# Patient Record
Sex: Female | Born: 1950
Health system: Southern US, Community
[De-identification: ages and names within clinical notes are randomized; demographics above are authoritative.]

## PROBLEM LIST (undated history)

## (undated) DIAGNOSIS — M5430 Sciatica, unspecified side: Secondary | ICD-10-CM

## (undated) DIAGNOSIS — Z87898 Personal history of other specified conditions: Secondary | ICD-10-CM

## (undated) DIAGNOSIS — E079 Disorder of thyroid, unspecified: Secondary | ICD-10-CM

## (undated) DIAGNOSIS — L659 Nonscarring hair loss, unspecified: Secondary | ICD-10-CM

## (undated) HISTORY — DX: Sciatica, unspecified side: M54.30

## (undated) HISTORY — DX: Personal history of other specified conditions: Z87.898

## (undated) HISTORY — DX: Disorder of thyroid, unspecified: E07.9

## (undated) HISTORY — DX: Nonscarring hair loss, unspecified: L65.9

---

## 1984-12-24 HISTORY — PX: ABDOMINAL HYSTERECTOMY: SHX81

## 1998-11-22 ENCOUNTER — Other Ambulatory Visit: Admission: RE | Admit: 1998-11-22 | Discharge: 1998-11-22 | Payer: Self-pay | Admitting: *Deleted

## 1999-08-21 ENCOUNTER — Ambulatory Visit (HOSPITAL_COMMUNITY): Admission: RE | Admit: 1999-08-21 | Discharge: 1999-08-21 | Payer: Self-pay | Admitting: Neurology

## 1999-08-21 ENCOUNTER — Encounter: Payer: Self-pay | Admitting: *Deleted

## 2000-01-22 ENCOUNTER — Other Ambulatory Visit: Admission: RE | Admit: 2000-01-22 | Discharge: 2000-01-22 | Payer: Self-pay | Admitting: *Deleted

## 2000-02-17 ENCOUNTER — Emergency Department (HOSPITAL_COMMUNITY): Admission: EM | Admit: 2000-02-17 | Discharge: 2000-02-17 | Payer: Self-pay | Admitting: Emergency Medicine

## 2000-02-20 ENCOUNTER — Encounter: Admission: RE | Admit: 2000-02-20 | Discharge: 2000-02-20 | Payer: Self-pay | Admitting: Family Medicine

## 2000-02-20 ENCOUNTER — Encounter: Payer: Self-pay | Admitting: Family Medicine

## 2001-02-12 ENCOUNTER — Other Ambulatory Visit: Admission: RE | Admit: 2001-02-12 | Discharge: 2001-02-12 | Payer: Self-pay | Admitting: *Deleted

## 2001-02-13 ENCOUNTER — Other Ambulatory Visit: Admission: RE | Admit: 2001-02-13 | Discharge: 2001-02-13 | Payer: Self-pay | Admitting: *Deleted

## 2001-02-13 ENCOUNTER — Encounter (INDEPENDENT_AMBULATORY_CARE_PROVIDER_SITE_OTHER): Payer: Self-pay

## 2002-02-16 ENCOUNTER — Other Ambulatory Visit: Admission: RE | Admit: 2002-02-16 | Discharge: 2002-02-16 | Payer: Self-pay | Admitting: *Deleted

## 2004-01-10 ENCOUNTER — Ambulatory Visit (HOSPITAL_COMMUNITY): Admission: RE | Admit: 2004-01-10 | Discharge: 2004-01-10 | Payer: Self-pay | Admitting: Gastroenterology

## 2004-02-24 ENCOUNTER — Other Ambulatory Visit: Admission: RE | Admit: 2004-02-24 | Discharge: 2004-02-24 | Payer: Self-pay | Admitting: *Deleted

## 2005-03-29 ENCOUNTER — Encounter: Admission: RE | Admit: 2005-03-29 | Discharge: 2005-03-29 | Payer: Self-pay | Admitting: Family Medicine

## 2006-04-23 ENCOUNTER — Ambulatory Visit: Payer: Self-pay | Admitting: Internal Medicine

## 2006-10-25 ENCOUNTER — Encounter: Admission: RE | Admit: 2006-10-25 | Discharge: 2006-10-25 | Payer: Self-pay | Admitting: Family Medicine

## 2007-02-26 ENCOUNTER — Emergency Department (HOSPITAL_COMMUNITY): Admission: EM | Admit: 2007-02-26 | Discharge: 2007-02-26 | Payer: Self-pay | Admitting: Emergency Medicine

## 2007-03-21 ENCOUNTER — Other Ambulatory Visit: Admission: RE | Admit: 2007-03-21 | Discharge: 2007-03-21 | Payer: Self-pay | Admitting: Obstetrics & Gynecology

## 2007-11-12 IMAGING — CR DG PELVIS 1-2V
1 series · 1 of 1 positions shown · non-contrast
Comparison: none

CLINICAL DATA: Left hip pain for one year.  No specific injury.
 PELVIS, ONE VIEW:
 There is no evidence of fracture or diastasis.  No other significant bone or soft tissue abnormalities are identified.

[view not recorded]
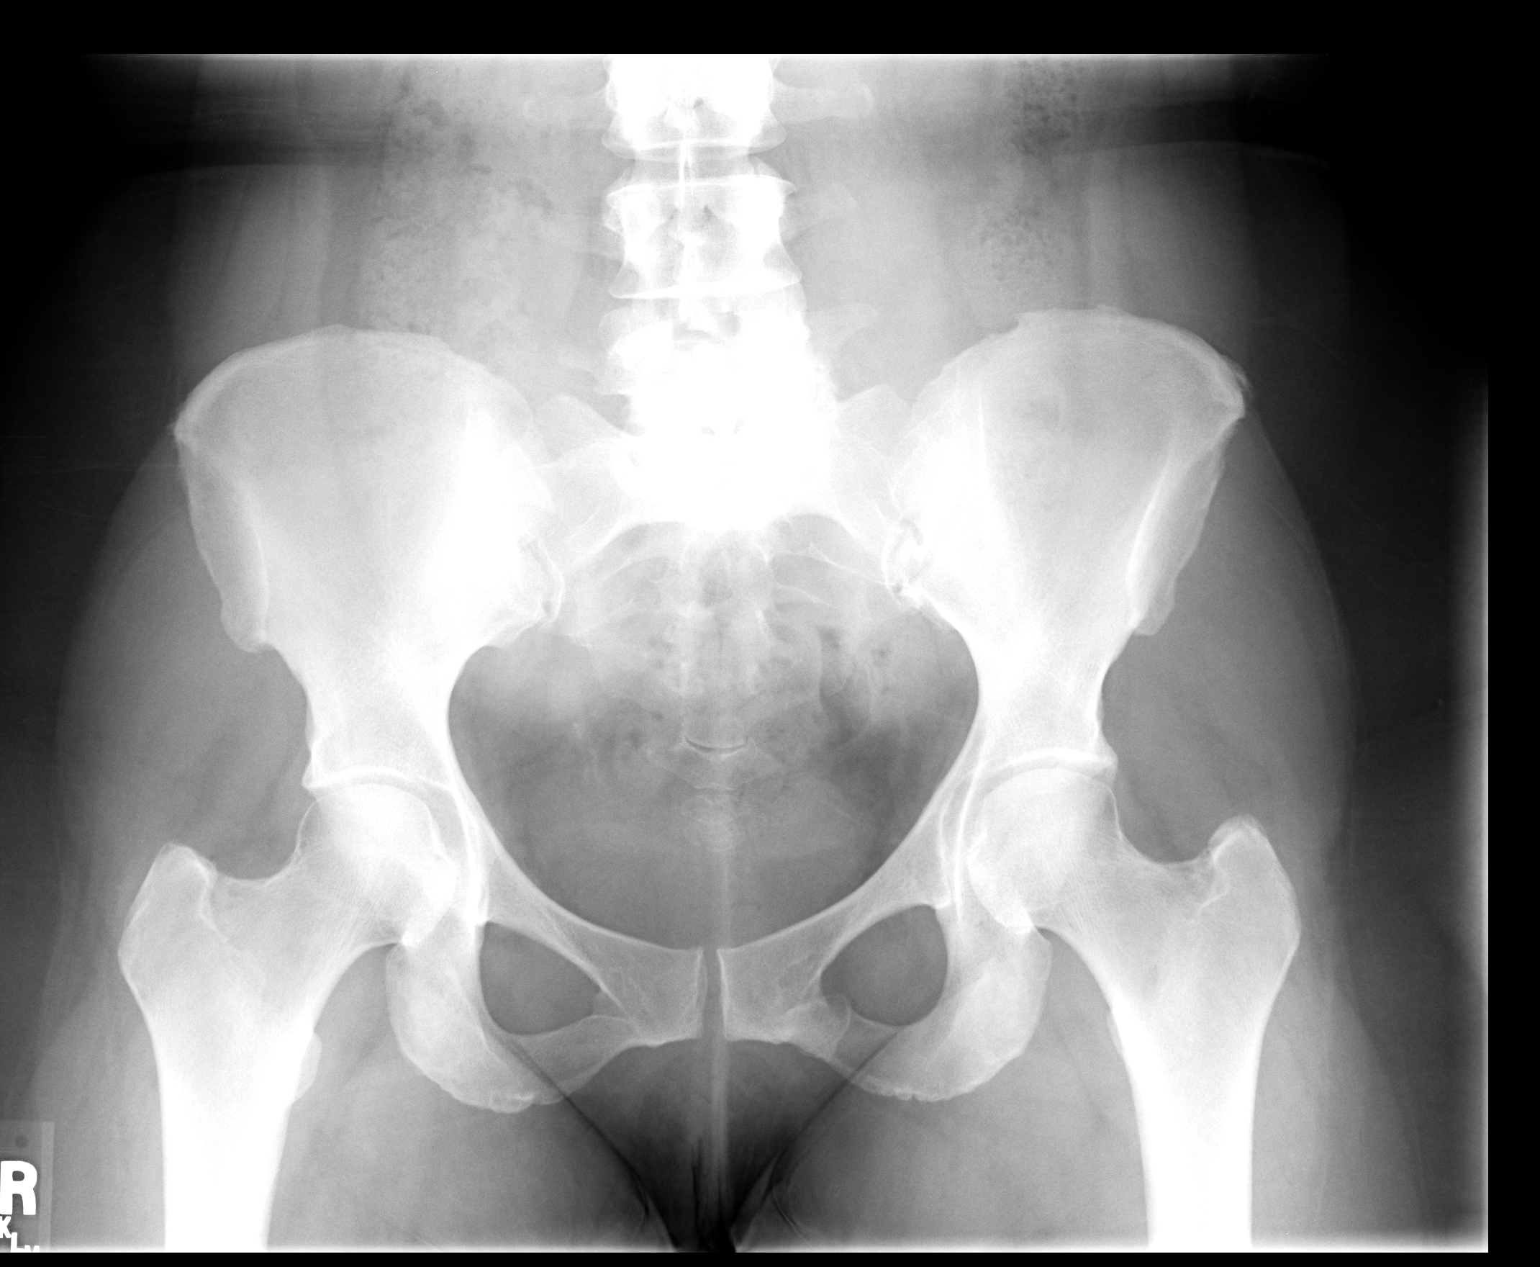

[1 of 1 positions shown; findings below may reference images not displayed]

IMPRESSION: Normal study.

## 2008-04-20 ENCOUNTER — Encounter: Admission: RE | Admit: 2008-04-20 | Discharge: 2008-04-20 | Payer: Self-pay | Admitting: Family Medicine

## 2008-05-13 ENCOUNTER — Other Ambulatory Visit: Admission: RE | Admit: 2008-05-13 | Discharge: 2008-05-13 | Payer: Self-pay | Admitting: Obstetrics & Gynecology

## 2009-08-09 ENCOUNTER — Encounter: Admission: RE | Admit: 2009-08-09 | Discharge: 2009-08-09 | Payer: Self-pay | Admitting: Endocrinology

## 2010-04-11 LAB — HM MAMMOGRAPHY

## 2011-05-11 NOTE — Op Note (Signed)
Deborah Alvarez, Deborah Alvarez                         ACCOUNT NO.:  [id number]   MEDICAL RECORD NO.:  [id number]                   PATIENT TYPE:  AMB   LOCATION:  ENDO                                 FACILITY:  [facility]   PHYSICIAN:  Anselmo Rod, M.D.               DATE OF BIRTH:  January 26, 1951   DATE OF PROCEDURE:  01/10/2004  DATE OF DISCHARGE:                                 OPERATIVE REPORT   PROCEDURE PERFORMED:  Screening colonoscopy.   ENDOSCOPIST:  Charna Elizabeth, M.D.   INSTRUMENT USED:  Olympus video colonoscope.   INDICATIONS FOR PROCEDURE:  The patient is a 60 year old African-American  female with history of rectal bleeding.  Rule out colonic polyps, masses,  etc.   PREPROCEDURE PREPARATION:  Informed consent was procured from the patient.  The patient was fasted for eight hours prior to the procedure and prepped  with a bottle of magnesium citrate and a gallon of GoLYTELY the night prior  to the procedure.   PREPROCEDURE PHYSICAL:  The patient had stable vital signs.  Neck supple.  Chest clear to auscultation.  S1 and S2 regular.  Abdomen soft with normal  bowel sounds.   DESCRIPTION OF PROCEDURE:  The patient was placed in left lateral decubitus  position and sedated with 80 mg of Demerol and 8 mg of Versed in slow  incremental doses.  Once the patient was adequately sedated and maintained  on low flow oxygen and continuous cardiac monitoring, the Olympus video  colonoscope was advanced from the rectum to the cecum and terminal ileum  without difficulty.  There was some residual stool in the colon and multiple  washes were done.  No masses, polyps, erosions, ulcerations or diverticula  were seen.  Small internal hemorrhoids were appreciated on retroflexion in  the rectum.  The appendicular orifice and ileocecal valve were clearly  visualized and photographed.   IMPRESSION:  Normal colonoscopy up to the terminal ileum except for small  internal hemorrhoids.   RECOMMENDATIONS:  1. Continue high fiber diet with liberal fluid intake.  2. Repeat colorectal cancer screening is recommended in the next 10 years     unless the patient develops any abnormal symptoms in the interim.  3. Outpatient followup as need arises in the future.                                               Anselmo Rod, M.D.    JNM/MEDQ  D:  01/10/2004  T:  01/10/2004  Job:  [id number]   cc:   Duncan Dull, M.D.  [address]  [place]  [place] [address]  Fax: [phone]

## 2012-05-21 LAB — HM DEXA SCAN

## 2013-04-20 ENCOUNTER — Other Ambulatory Visit: Payer: Self-pay | Admitting: Radiology

## 2013-06-05 ENCOUNTER — Ambulatory Visit: Payer: Self-pay | Admitting: Certified Nurse Midwife

## 2013-06-29 ENCOUNTER — Ambulatory Visit (INDEPENDENT_AMBULATORY_CARE_PROVIDER_SITE_OTHER): Payer: BC Managed Care – PPO | Admitting: Certified Nurse Midwife

## 2013-06-29 ENCOUNTER — Encounter: Payer: Self-pay | Admitting: Certified Nurse Midwife

## 2013-06-29 VITALS — BP 102/60 | HR 60 | Resp 16 | Ht 61.5 in | Wt 147.0 lb

## 2013-06-29 DIAGNOSIS — Z01419 Encounter for gynecological examination (general) (routine) without abnormal findings: Secondary | ICD-10-CM

## 2013-06-29 DIAGNOSIS — Z0000 Encounter for general adult medical examination without abnormal findings: Secondary | ICD-10-CM

## 2013-06-29 LAB — POCT URINALYSIS DIPSTICK
Leukocytes, UA: NEGATIVE
Nitrite, UA: NEGATIVE
Protein, UA: NEGATIVE
pH, UA: 5

## 2013-06-29 NOTE — Patient Instructions (Signed)

## 2013-06-29 NOTE — Progress Notes (Signed)
62 y.o. [id number] Married African American Fe here for annual exam. Menopausal no HRT. Denies vaginal bleeding. Using Olive Oil for moisture. Thyroid medication stable, management with Endocrine.  Sees PCP for labs and aex.  Patient had left breast biopsy 4-14 for cyst, benign per patient.(No record here)Record with PCP. No other health issues today.  Celebrating 30 years of marriage!  Patient's last menstrual period was 12/24/1984.          Sexually active: yes  The current method of family planning is status post hysterectomy.    Exercising: yes  cardio,strength training, weights & yoga Smoker:  no  Health Maintenance: Pap: 6/12 neg MMG:  4/14 & biopsy Colonoscopy:  2005 Patient did IFOB with PCP negative per patient BMD:   2013 TDaP:  2007 Labs: Poct urine-neg Self breast exam: done occ   reports that she has never smoked. She does not have any smokeless tobacco history on file. She reports that she drinks about 2.0 ounces of alcohol per week. She reports that she does not use illicit drugs.  Past Medical History  Diagnosis Date  . Thyroid disease   . Alopecia   . Hx of abnormal Pap smear     hx severe dysplasia    Past Surgical History  Procedure Laterality Date  . 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 Cholecalciferol (VITAMIN D) 400 UNITS capsule Take 400 Units by mouth daily. Takes 2 tablets twice daily.      . fish oil-omega-3 fatty acids 1000 MG capsule Take 2 g by mouth daily.      Marland Kitchen levothyroxine (SYNTHROID, LEVOTHROID) 88 MCG tablet Take 88 mcg by mouth daily before breakfast.      . minoxidil (ROGAINE) 2 % external solution Apply 1 application topically 2 (two) times daily.      . Multiple Vitamin (MULTIVITAMIN) capsule Take 1 capsule by mouth daily.      Marland Kitchen omeprazole (PRILOSEC) 20 MG capsule Take 20 mg by mouth daily.      . vitamin E 100 UNIT capsule Take  100 Units by mouth daily.       No current facility-administered medications for this visit.    Family History  Problem Relation Age of Onset  . Diabetes Father     ROS:  Pertinent items are noted in HPI.  Otherwise, a comprehensive ROS was negative.  Exam:   Ht 5' 1.5" (1.562 m)  Wt 147 lb (66.679 kg)  BMI 27.33 kg/m2  LMP 12/24/1984 Height: 5' 1.5" (156.2 cm)  Ht Readings from Last 3 Encounters:  06/29/13 5' 1.5" (1.562 m)    General appearance: alert, cooperative and appears stated age Head: Normocephalic, without obvious abnormality, atraumatic Neck: no adenopathy, supple, symmetrical, trachea midline and thyroid normal to inspection and palpation Lungs: clear to auscultation bilaterally Breasts: normal appearance, no masses or tenderness, No nipple retraction or dimpling, No nipple discharge or bleeding, No axillary or supraclavicular adenopathy Heart: regular rate and rhythm Abdomen: soft, non-tender; no masses,  no organomegaly Extremities: extremities normal, atraumatic, no cyanosis or edema Skin: Skin color, texture, turgor normal. No rashes or lesions Lymph nodes: Cervical, supraclavicular, and axillary nodes normal. No abnormal inguinal nodes palpated Neurologic: Grossly normal   Pelvic: External genitalia:  no lesions              Urethra:  normal appearing urethra with no masses, tenderness or lesions  Bartholin's and Skene's: normal                 Vagina: normal appearing vagina with normal color and discharge, no lesions              Cervix: absent              Pap taken: yes Bimanual Exam:  Uterus:  uterus absent              Adnexa: normal adnexa and no mass, fullness, tenderness               Rectovaginal: Confirms               Anus:  normal sphincter tone, no lesions  A:  Well Woman with normal exam  Menopausal no HRT  History of abnormal pap with [facility], with normal pap smear  Hypothyroid stable medication with Endocrine  management  Recent Left breast biopsy for cyst noted on mammogram, benign per patient- records requested.   P:   Reviewed health and wellness pertinent to exam  Aware of need to advise if vaginal bleeding  Continue follow up with endocrine as indicated.  Pap smear as per guidelines   Mammogram yearly or as indicated pap smear taken today  counseled on breast self exam, mammography screening, adequate intake of calcium and vitamin D, diet and exercise  return annually or prn  An After Visit Summary was printed and given to the patient.  Reviewed, TL

## 2014-07-02 ENCOUNTER — Ambulatory Visit: Payer: BC Managed Care – PPO | Admitting: Certified Nurse Midwife

## 2014-07-05 ENCOUNTER — Ambulatory Visit (INDEPENDENT_AMBULATORY_CARE_PROVIDER_SITE_OTHER): Payer: BC Managed Care – PPO | Admitting: Certified Nurse Midwife

## 2014-07-05 ENCOUNTER — Encounter: Payer: Self-pay | Admitting: Certified Nurse Midwife

## 2014-07-05 VITALS — BP 112/78 | HR 84 | Resp 18 | Ht 62.0 in | Wt 146.0 lb

## 2014-07-05 DIAGNOSIS — Z01419 Encounter for gynecological examination (general) (routine) without abnormal findings: Secondary | ICD-10-CM

## 2014-07-05 DIAGNOSIS — E039 Hypothyroidism, unspecified: Secondary | ICD-10-CM

## 2014-07-05 DIAGNOSIS — Z0000 Encounter for general adult medical examination without abnormal findings: Secondary | ICD-10-CM

## 2014-07-05 DIAGNOSIS — Z124 Encounter for screening for malignant neoplasm of cervix: Secondary | ICD-10-CM

## 2014-07-05 LAB — POCT URINALYSIS DIPSTICK
BILIRUBIN UA: NEGATIVE
Blood, UA: NEGATIVE
Glucose, UA: NEGATIVE
KETONES UA: NEGATIVE
LEUKOCYTES UA: NEGATIVE
NITRITE UA: NEGATIVE
PH UA: 5
Protein, UA: NEGATIVE
Urobilinogen, UA: NEGATIVE

## 2014-07-05 NOTE — Progress Notes (Signed)
63 y.o. [id number] Married African American Fe here for annual exam. Menopausal no HRT, denies vaginal bleeding or vaginal dryness. Sees PCP yearly with labs, aex, prn all normal. Sees Dr Romero BellingBalin for hypothyroid with stable medication. Using Olive Oil for vaginal dryness, working well. No health issues today.  Patient's last menstrual period was 12/24/1984.          Sexually active: Yes.    The current method of family planning is status post hysterectomy.    Exercising: Yes.    Cardio, Weights, pillates, zumba 4 x weekly  Smoker:  no  Health Maintenance: Pap: 06/2013 Neg. HR HPV Neg MMG: 03/2014 BIRADS2: Benign  Colonoscopy: 02/2014 Polyps - Every 10 years  BMD: 04/2012   TDaP: 2009 Labs: PCP   reports that she has never smoked. She does not have any smokeless tobacco history on file. She reports that she drinks about 2 - 2.5 ounces of alcohol per week. She reports that she does not use illicit drugs.  Past Medical History  Diagnosis Date  . Alopecia   . Hx of abnormal Pap smear     hx severe dysplasia  . Thyroid disease     hypothyroidism    Past Surgical History  Procedure Laterality Date  . 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 ALPRAZolam (XANAX) 0.25 MG tablet Take 0.25 mg by mouth at bedtime as needed.       . Cholecalciferol (VITAMIN D) 400 UNITS capsule Take 400 Units by mouth daily.       . Ibuprofen (ADVIL) 200 MG CAPS Take by mouth as needed.      Marland Kitchen. levothyroxine (SYNTHROID, LEVOTHROID) 88 MCG tablet Take 88 mcg by mouth daily before breakfast.      . Multiple Vitamin (MULTIVITAMIN) capsule Take 1 capsule by mouth daily.      . Omega-3 Fatty Acids (FISH OIL PO) Take by mouth daily.      Marland Kitchen. omeprazole (PRILOSEC) 20 MG capsule Take 20 mg by mouth daily.       No current facility-administered medications for this visit.    Family History  Problem Relation Age of  Onset  . Diabetes Father   . Hypertension Mother   . Breast cancer Maternal Aunt     ROS:  Pertinent items are noted in HPI.  Otherwise, a comprehensive ROS was negative.  Exam:   BP 112/78  Pulse 84  Resp 18  Ht 5\' 2"  (1.575 m)  Wt 146 lb (66.225 kg)  BMI 26.70 kg/m2  LMP 12/24/1984 Height: 5\' 2"  (157.5 cm)  Ht Readings from Last 3 Encounters:  07/05/14 5\' 2"  (1.575 m)  06/29/13 5' 1.5" (1.562 m)    General appearance: alert, cooperative and appears stated age Head: Normocephalic, without obvious abnormality, atraumatic Neck: no adenopathy, supple, symmetrical, trachea midline and thyroid normal to inspection and palpation and non-palpable Lungs: clear to auscultation bilaterally Breasts: normal appearance, no masses or tenderness, No nipple retraction or dimpling, No nipple discharge or bleeding, No axillary or supraclavicular adenopathy Heart: regular rate and rhythm Abdomen: soft, non-tender; no masses,  no organomegaly Extremities: extremities normal, atraumatic, no cyanosis or edema Skin: Skin color, texture, turgor normal. No rashes or lesions Lymph nodes: Cervical, supraclavicular, and axillary nodes normal. No abnormal inguinal nodes palpated Neurologic: Grossly normal   Pelvic: External genitalia:  no lesions  Urethra:  normal appearing urethra with no masses, tenderness or lesions              Bartholin's and Skene's: normal                 Vagina: normal appearing vagina with normal color and discharge, no lesions              Cervix: absent              Pap taken: Yes.   Bimanual Exam:  Uterus:  uterus absent              Adnexa: normal adnexa and no mass, fullness, tenderness               Rectovaginal: Confirms               Anus:  normal sphincter tone, no lesions  A:  Well Woman with normal exam  Menopausal no HRT  Hypothyroid stable with Endocrine management  History of severe dysplasia with s/pTAH ovaries retained    P:   Reviewed  health and wellness pertinent to exam  Continue as indicated with MD  Pap smear taken today with HPVHR   counseled on breast self exam, mammography screening, adequate intake of calcium and vitamin D, diet and exercise  return annually or prn  An After Visit Summary was printed and given to the patient.

## 2014-07-05 NOTE — Progress Notes (Signed)
Reviewed personally.  M. Suzanne Fredda Clarida, MD.  

## 2014-07-05 NOTE — Patient Instructions (Signed)

## 2014-07-08 LAB — IPS PAP TEST WITH HPV

## 2014-08-25 ENCOUNTER — Telehealth: Payer: Self-pay

## 2014-08-25 NOTE — Telephone Encounter (Signed)
Called pt to give bmd results. lmtcb

## 2014-09-02 NOTE — Telephone Encounter (Signed)
Patient notified of results. See results scanned in

## 2014-09-14 ENCOUNTER — Telehealth: Payer: Self-pay

## 2014-09-14 NOTE — Telephone Encounter (Signed)
Called pt to give her bmd results & pt states she was called with these results already. We received duplicate reports

## 2014-10-25 ENCOUNTER — Encounter: Payer: Self-pay | Admitting: Certified Nurse Midwife

## 2015-04-18 ENCOUNTER — Other Ambulatory Visit: Payer: Self-pay | Admitting: Family Medicine

## 2015-04-18 ENCOUNTER — Ambulatory Visit
Admission: RE | Admit: 2015-04-18 | Discharge: 2015-04-18 | Disposition: A | Discharge status: Home / Self Care | Payer: BLUE CROSS/BLUE SHIELD | Source: Ambulatory Visit | Attending: Family Medicine | Admitting: Family Medicine

## 2015-04-18 DIAGNOSIS — S4991XA Unspecified injury of right shoulder and upper arm, initial encounter: Secondary | ICD-10-CM

## 2015-04-27 ENCOUNTER — Ambulatory Visit (INDEPENDENT_AMBULATORY_CARE_PROVIDER_SITE_OTHER): Payer: BLUE CROSS/BLUE SHIELD | Admitting: Podiatry

## 2015-04-27 ENCOUNTER — Encounter: Payer: Self-pay | Admitting: Podiatry

## 2015-04-27 VITALS — BP 120/85 | HR 91 | Resp 15

## 2015-04-27 DIAGNOSIS — L600 Ingrowing nail: Secondary | ICD-10-CM

## 2015-04-27 MED ORDER — NEOMYCIN-POLYMYXIN-HC 3.5-10000-1 OT SOLN: OTIC | Status: DC

## 2015-04-27 NOTE — Patient Instructions (Signed)

## 2015-04-27 NOTE — Progress Notes (Signed)
   Subjective:    Patient ID: Deborah BootCharlene Kattner, female    DOB: [birth year], 64 y.o.   MRN: [id number]  HPI Pt presents with right great nail thickness and discoloration   Review of Systems  All other systems reviewed and are negative.      Objective:   Physical Exam        Assessment & Plan:

## 2015-04-28 NOTE — Progress Notes (Signed)
Subjective:     Patient ID: Deborah Alvarez, female   DOB: 19-Dec-1951, 64 y.o.   MRN: [id number]  HPI patient presents with damaged hallux nail right knowing that she needs to have it removed and been thinking about it for the last couple years well the nail continues to be sore and impossible to cut   Review of Systems     Objective:   Physical Exam Neurovascular status intact muscle strength adequate with deformed thick brittle hallux nail right foot that's painful when pressed dorsally    Assessment:     Damaged hallux nail right with pain    Plan:     Reviewed condition discussing conservative and surgical treatment options and patient wants it fixed permanently. Explained procedure and risk associated with this and at this time infiltrated the right hallux 60 mg Xylocaine Marcaine mixture remove the hallux nail exposed matrix and applied phenol 5 applications 30 seconds followed by alcohol lavaged and sterile dressing. Gave instructions on soaks and reappoint

## 2015-05-19 ENCOUNTER — Encounter: Payer: Self-pay | Admitting: Podiatry

## 2015-05-19 ENCOUNTER — Ambulatory Visit (INDEPENDENT_AMBULATORY_CARE_PROVIDER_SITE_OTHER): Payer: BLUE CROSS/BLUE SHIELD | Admitting: Podiatry

## 2015-05-19 VITALS — BP 119/80 | HR 76 | Resp 14

## 2015-05-19 DIAGNOSIS — L600 Ingrowing nail: Secondary | ICD-10-CM | POA: Diagnosis not present

## 2015-05-19 NOTE — Patient Instructions (Signed)

## 2015-05-19 NOTE — Progress Notes (Signed)
Subjective:     Patient ID: Deborah Alvarez, female   DOB: 12/28/1950, 64 y.o.   MRN: [id number]  HPI patient presents stating I was just concerned about this right big toenail that was removed about a month ago that there is still crusted tissue and drainage and I want to make sure I don't have infection   Review of Systems     Objective:   Physical Exam Neurovascular status intact muscle strength adequate range of motion within normal limits with a minimal amount of drainage in the proximal portion of the nailbed and crust formation with no indications of proximal edema erythema drainage noted    Assessment:     Localized paronychia infection right hallux    Plan:     Advised on continued soaks at this time and utilization of Band-Aids therapy during the day. Reappoint to recheck

## 2015-07-12 ENCOUNTER — Encounter: Payer: Self-pay | Admitting: Certified Nurse Midwife

## 2015-07-12 ENCOUNTER — Ambulatory Visit (INDEPENDENT_AMBULATORY_CARE_PROVIDER_SITE_OTHER): Payer: BLUE CROSS/BLUE SHIELD | Admitting: Certified Nurse Midwife

## 2015-07-12 VITALS — BP 104/70 | HR 70 | Resp 16 | Ht 61.75 in | Wt 149.0 lb

## 2015-07-12 DIAGNOSIS — Z0000 Encounter for general adult medical examination without abnormal findings: Secondary | ICD-10-CM | POA: Diagnosis not present

## 2015-07-12 DIAGNOSIS — Z01419 Encounter for gynecological examination (general) (routine) without abnormal findings: Secondary | ICD-10-CM

## 2015-07-12 LAB — POCT URINALYSIS DIPSTICK
BILIRUBIN UA: NEGATIVE
Glucose, UA: NEGATIVE
Ketones, UA: NEGATIVE
LEUKOCYTES UA: NEGATIVE
Nitrite, UA: NEGATIVE
PROTEIN UA: NEGATIVE
RBC UA: NEGATIVE
UROBILINOGEN UA: NEGATIVE
pH, UA: 5

## 2015-07-12 NOTE — Progress Notes (Signed)
64 y.o. [id number] Married  African American Fe here for annual exam. Menopausal no HRT. Denies vaginal bleeding or vaginal dryness. Patient sees Dr.Wolters for aex/labs. Sees Dr. Talmage Nap for hypothyroid, no changes with medication. Alopecia unchanged just wears a weave.  Recent right greater toe surgery 4/16, no concerns. No health issues today.  Patient's last menstrual period was 12/24/1984.          Sexually active: Yes.    The current method of family planning is status post hysterectomy.    Exercising: Yes.    cardio, strength,weights Smoker:  no  Health Maintenance: Pap: 07-05-14 neg HPV HR neg MMG: 5/16 Colonoscopy:  3/15 polyps f/u 44yrs BMD:   08-19-14 low bone mass,f/u 37yrs TDaP:  2016 Labs: poct urine-neg Self breast exam: done occ   reports that she has never smoked. She has never used smokeless tobacco. She reports that she drinks about 2.4 oz of alcohol per week. She reports that she does not use illicit drugs.  Past Medical History  Diagnosis Date  . Alopecia   . Hx of abnormal Pap smear     hx severe dysplasia  . Thyroid disease     hypothyroidism    Past Surgical History  Procedure Laterality Date  . 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 ALPRAZolam (XANAX) 0.25 MG tablet Take 0.25 mg by mouth at bedtime as needed.     . Cholecalciferol (VITAMIN D) 400 UNITS capsule Take 400 Units by mouth daily.     . Ibuprofen (ADVIL) 200 MG CAPS Take by mouth as needed.    Marland Kitchen levothyroxine (SYNTHROID, LEVOTHROID) 88 MCG tablet Take 88 mcg by mouth daily before breakfast.    . Multiple Vitamin (MULTIVITAMIN) capsule Take 1 capsule by mouth daily.    . Naproxen Sodium (ALEVE PO) Take by mouth as needed.    . Omega-3 Fatty Acids (FISH OIL PO) Take by mouth daily.    Marland Kitchen omeprazole (PRILOSEC) 20 MG capsule Take 20 mg by mouth daily.     No current facility-administered  medications for this visit.    Family History  Problem Relation Age of Onset  . Diabetes Father   . Hypertension Mother   . Breast cancer Maternal Aunt     ROS:  Pertinent items are noted in HPI.  Otherwise, a comprehensive ROS was negative.  Exam:   BP 104/70 mmHg  Pulse 70  Resp 16  Ht 5' 1.75" (1.568 m)  Wt 149 lb (67.586 kg)  BMI 27.49 kg/m2  LMP 12/24/1984 Height: 5' 1.75" (156.8 cm) Ht Readings from Last 3 Encounters:  07/12/15 5' 1.75" (1.568 m)  07/05/14  (1.575 m)  06/29/13 5' 1.5" (1.562 m)    General appearance: alert, cooperative and appears stated age Head: Normocephalic, without obvious abnormality, atraumatic Neck: no adenopathy, supple, symmetrical, trachea midline and thyroid normal to inspection and palpation Lungs: clear to auscultation bilaterally Breasts: normal appearance, no masses or tenderness, No nipple retraction or dimpling, No nipple discharge or bleeding, No axillary or supraclavicular adenopathy Heart: regular rate and rhythm Abdomen: soft, non-tender; no masses,  no organomegaly Extremities: extremities normal, atraumatic, no cyanosis or edema Skin: Skin color, texture, turgor normal. No rashes or lesions Lymph nodes: Cervical, supraclavicular, and axillary nodes normal. No abnormal inguinal nodes palpated Neurologic: Grossly normal   Pelvic: External genitalia:  no lesions  Urethra:  normal appearing urethra with no masses, tenderness or lesions              Bartholin's and Skene's: normal                 Vagina: normal appearing vagina with normal color and discharge, no lesions              Cervix: absent              Pap taken: No. Bimanual Exam:  Uterus:  uterus absent              Adnexa: normal adnexa and no mass, fullness, tenderness               Rectovaginal: Confirms               Anus:  normal sphincter tone, no lesions  Chaperone present: Yes  A:  Well Woman with normal exam  Menopausal no HRT. S/P TAH  with ovaries retained due to abnormal pap smear dysplasia 1986  Vaginal dryness using Olive Oil with good results  Hypothyroid with Endocrine management  P:   Reviewed health and wellness pertinent to exam  Continue follow up with endocrine as indicated  Pap smear not taken today   counseled on breast self exam, mammography screening, adequate intake of calcium and vitamin D, diet and exercise  return annually or prn  An After Visit Summary was printed and given to the patient.

## 2015-07-12 NOTE — Patient Instructions (Signed)

## 2015-07-12 NOTE — Progress Notes (Signed)
Reviewed personally.  M. Suzanne Drayven Marchena, MD.  

## 2016-05-05 IMAGING — CR DG SHOULDER 2+V*R*
3 series · 3 of 3 positions shown · non-contrast
Comparison: None.

CLINICAL DATA: Fell onto RIGHT shoulder 2 days ago.  Pain.

EXAM:
RIGHT SHOULDER - 2+ VIEW

[w shoulder grashey right]
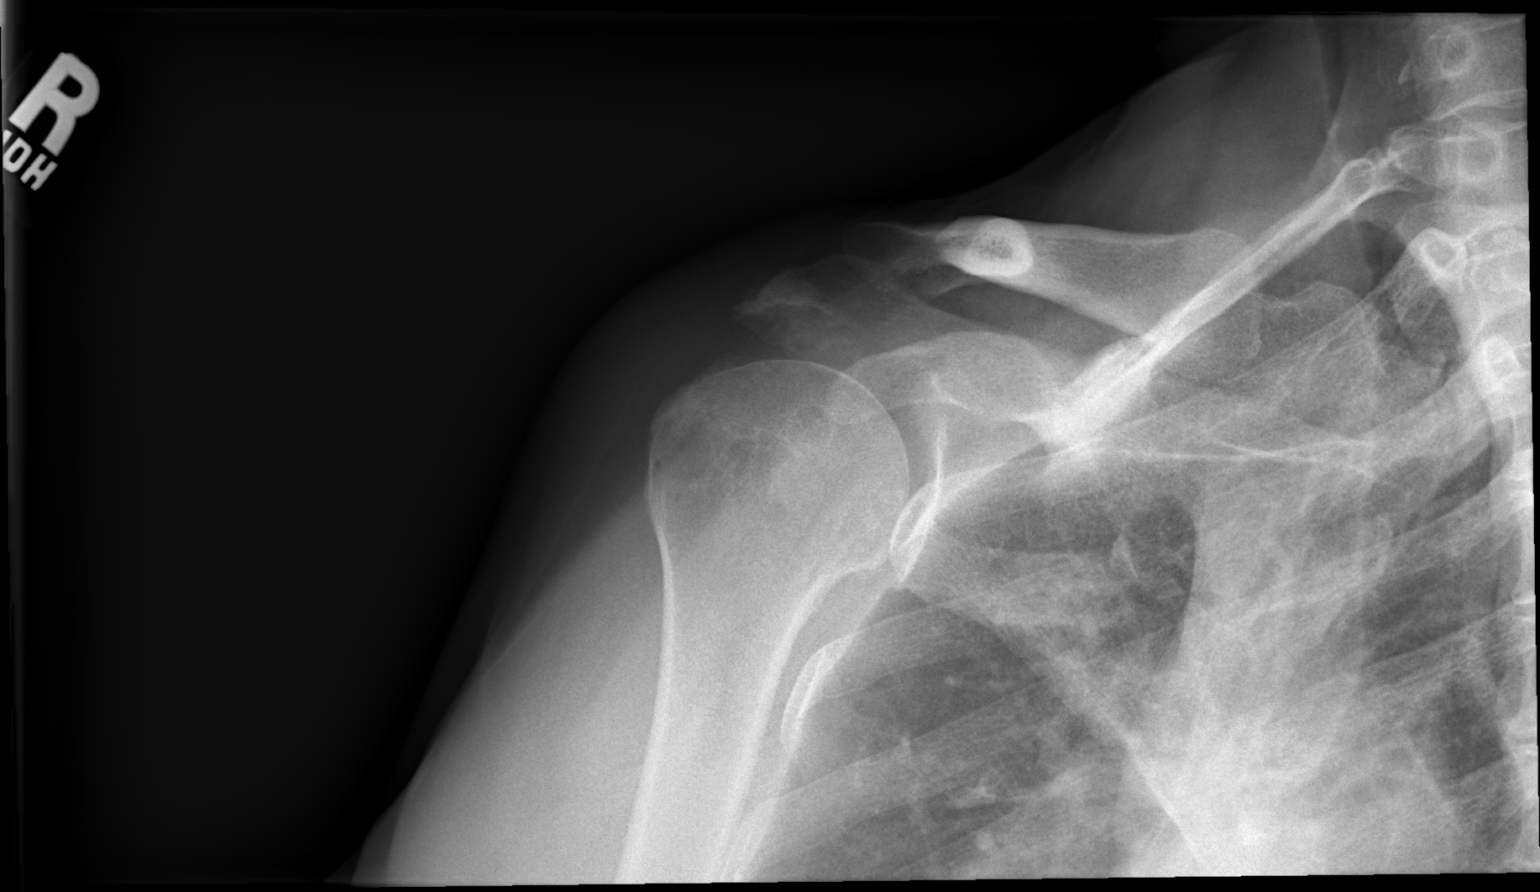

[w shoulder y-view right]
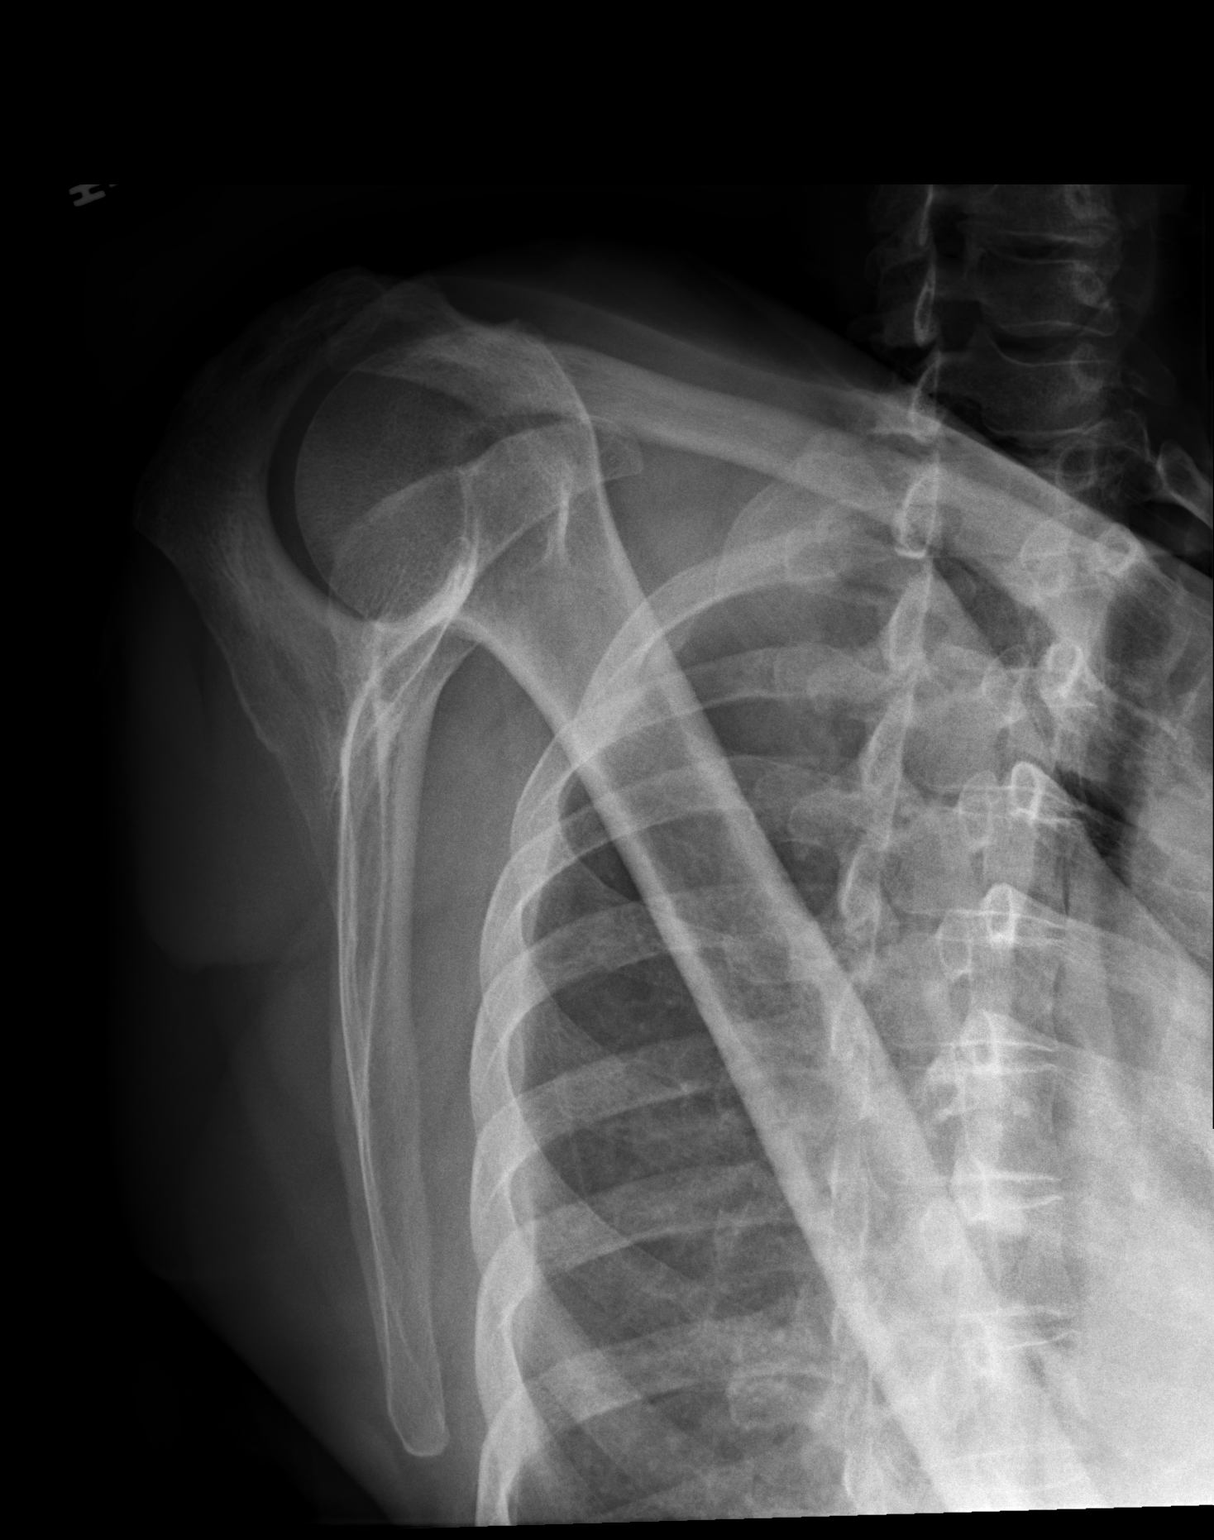

[w shoulder axillary right]
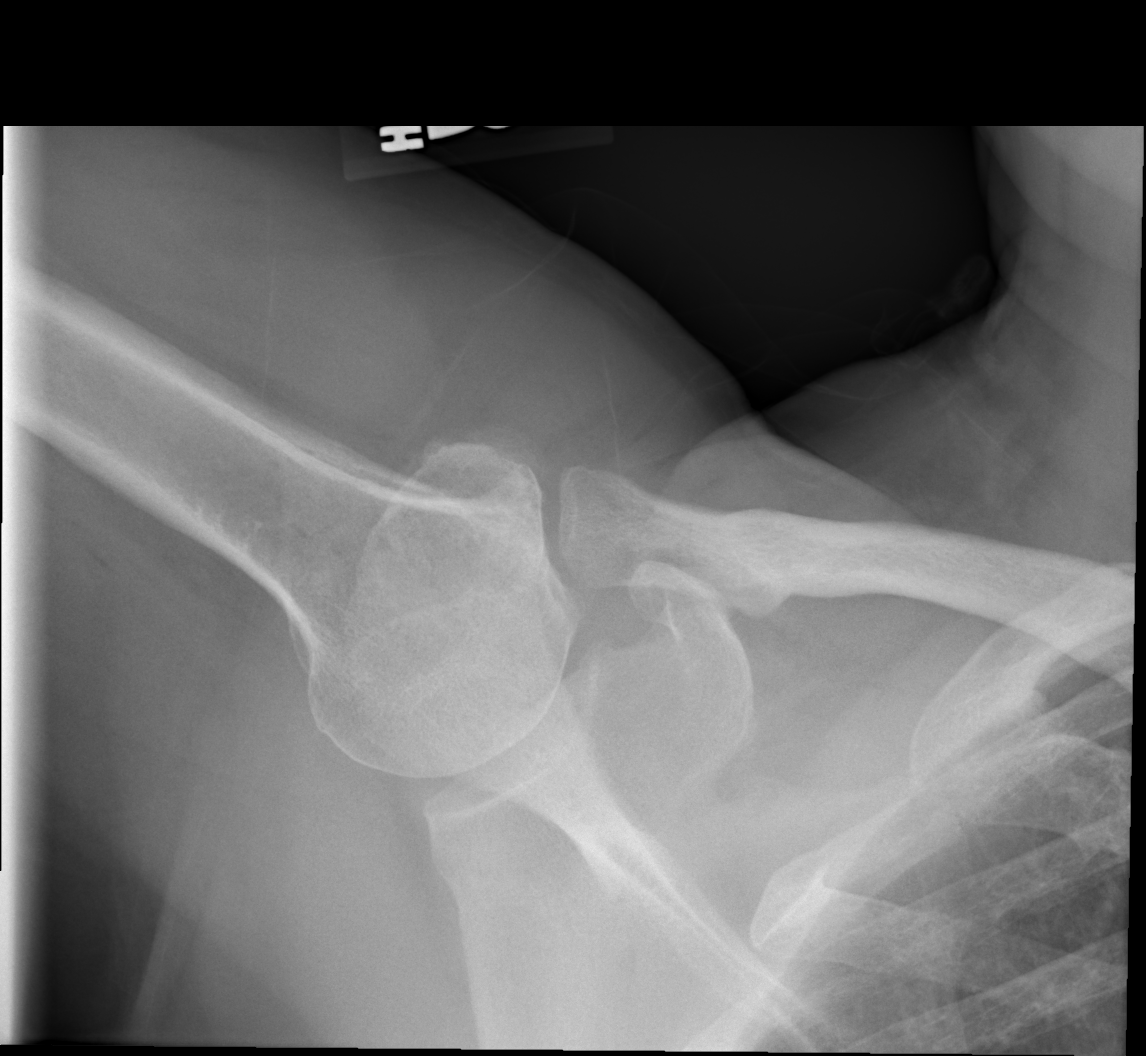

[3 of 3 positions shown; findings below may reference images not displayed]

FINDINGS: The patient has limited range of motion. Therefore nonstandard
imaging was obtained.

There is no evidence of fracture or dislocation. There is no
evidence of arthropathy or other focal bone abnormality. Soft
tissues are unremarkable.
IMPRESSION: Negative.

## 2016-07-12 ENCOUNTER — Ambulatory Visit: Payer: BLUE CROSS/BLUE SHIELD | Admitting: Certified Nurse Midwife

## 2016-07-17 ENCOUNTER — Encounter: Payer: Self-pay | Admitting: Certified Nurse Midwife

## 2016-07-17 ENCOUNTER — Ambulatory Visit (INDEPENDENT_AMBULATORY_CARE_PROVIDER_SITE_OTHER): Payer: BLUE CROSS/BLUE SHIELD | Admitting: Certified Nurse Midwife

## 2016-07-17 VITALS — BP 110/68 | HR 80 | Resp 16 | Ht 61.25 in | Wt 153.0 lb

## 2016-07-17 DIAGNOSIS — Z124 Encounter for screening for malignant neoplasm of cervix: Secondary | ICD-10-CM

## 2016-07-17 DIAGNOSIS — Z01419 Encounter for gynecological examination (general) (routine) without abnormal findings: Secondary | ICD-10-CM | POA: Diagnosis not present

## 2016-07-17 DIAGNOSIS — Z0000 Encounter for general adult medical examination without abnormal findings: Secondary | ICD-10-CM | POA: Diagnosis not present

## 2016-07-17 DIAGNOSIS — N951 Menopausal and female climacteric states: Secondary | ICD-10-CM | POA: Diagnosis not present

## 2016-07-17 DIAGNOSIS — N898 Other specified noninflammatory disorders of vagina: Secondary | ICD-10-CM

## 2016-07-17 LAB — POCT URINALYSIS DIPSTICK
Bilirubin, UA: NEGATIVE
GLUCOSE UA: NEGATIVE
Ketones, UA: NEGATIVE
Leukocytes, UA: NEGATIVE
Nitrite, UA: NEGATIVE
PH UA: 5
Protein, UA: NEGATIVE
RBC UA: NEGATIVE
UROBILINOGEN UA: NEGATIVE

## 2016-07-17 NOTE — Progress Notes (Signed)
65 y.o. G16P3003 Married  Caucasian Fe here for annual exam. Menopausal no HRT. Using Olive Oil for vaginal dryness. Some increase in vaginal discharge with slight odor. No pain with sexual activity. Please check. Sees Dr. Mick Sell for aex/labs,thyroid management. Alopecia(genetic) unchanged, continues to wear a weave and is doing well. Father passed earlier the year. Emotionally doing well. No other health concerns today.  Patient's last menstrual period was 12/24/1984.          Sexually active: Yes.    The current method of family planning is status post hysterectomy.    Exercising: Yes.    cardio, strength & weights Smoker:  no  Health Maintenance: Pap:  07-05-14 neg HPV HR neg MMG:  4/17 neg Colonoscopy:  3/15 polyps f/u 56yrs BMD:   2015 f/u 25yrs, scheduled for 9/17 TDaP:  2016 Shingles: 2014 Pneumonia: not done Hep C and HIV: 2017 hep c neg Labs: poct urine-neg Self breast exam: done monthly   reports that she has never smoked. She has never used smokeless tobacco. She reports that she drinks about 2.4 oz of alcohol per week . She reports that she does not use drugs.  Past Medical History:  Diagnosis Date  . Alopecia   . Hx of abnormal Pap smear    hx severe dysplasia  . Thyroid disease    hypothyroidism    Past Surgical History:  Procedure Laterality Date  . 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 ALPRAZolam (XANAX) 0.25 MG tablet Take 0.25 mg by mouth at bedtime as needed.     . Cholecalciferol (VITAMIN D) 400 UNITS capsule Take 400 Units by mouth daily.     . Ibuprofen (ADVIL) 200 MG CAPS Take by mouth as needed.    Marland Kitchen levothyroxine (SYNTHROID, LEVOTHROID) 88 MCG tablet Take 88 mcg by mouth daily before breakfast.    . Multiple Vitamin (MULTIVITAMIN) capsule Take 1 capsule by mouth daily.    . Naproxen Sodium (ALEVE PO) Take by mouth as needed.    . Omega-3 Fatty  Acids (FISH OIL PO) Take by mouth daily.    Marland Kitchen omeprazole (PRILOSEC) 20 MG capsule Take 20 mg by mouth daily.     No current facility-administered medications for this visit.     Family History  Problem Relation Age of Onset  . Diabetes Father   . Hypertension Mother   . Breast cancer Maternal Aunt     ROS:  Pertinent items are noted in HPI.  Otherwise, a comprehensive ROS was negative.  Exam:   LMP 12/24/1984    Ht Readings from Last 3 Encounters:  07/12/15 5' 1.75" (1.568 m)  07/05/14  (1.575 m)  06/29/13 5' 1.5" (1.562 m)    General appearance: alert, cooperative and appears stated age Head: Normocephalic, without obvious abnormality, atraumatic Neck: no adenopathy, supple, symmetrical, trachea midline and thyroid normal to inspection and palpation Lungs: clear to auscultation bilaterally Breasts: normal appearance, no masses or tenderness, No nipple retraction or dimpling, No nipple discharge or bleeding, No axillary or supraclavicular adenopathy Heart: regular rate and rhythm Abdomen: soft, non-tender; no masses,  no organomegaly Extremities: extremities normal, atraumatic, no cyanosis or edema Skin: Skin color, texture, turgor normal. No rashes or lesions Lymph nodes: Cervical, supraclavicular, and axillary nodes normal. No abnormal inguinal nodes palpated Neurologic: Grossly normal   Pelvic: External genitalia:  no lesions  Urethra:  normal appearing urethra with no masses, tenderness or lesions              Bartholin's and Skene's: normal                 Vagina: normal appearing vagina with normal color and discharge, no lesions              Cervix: absent              Pap taken: Yes.   Bimanual Exam:  Uterus:  uterus absent              Adnexa: normal adnexa and no mass, fullness, tenderness               Rectovaginal: Confirms               Anus:  normal sphincter tone, no lesions  Chaperone present: yes  A:  Well Woman with normal  exam  Menopausal no HRT  S/P TAH ovaries retained severe dysplasia  Vaginal dryness using Olive oil, R/O vaginal infection  Hypothyroid stable with PCP management  Genetic Alopecia no change  P:   Reviewed health and wellness pertinent to exam  Discussed vaginal dryness can lead to vaginal infection due to change in ph. Continue with Olive Oil 3 x weekly, given a sterile needless syringe to help with insertion.warning signs of dryness given and need to advise.  Lab: Affirm will treat per results.  Continue follow up with MD as indicated  Pap smear as above with HPV reflex    counseled on breast self exam, mammography screening, adequate intake of calcium and vitamin D, diet and exercise  return annually or prn  An After Visit Summary was printed and given to the patient.

## 2016-07-17 NOTE — Patient Instructions (Signed)

## 2016-07-18 LAB — WET PREP BY MOLECULAR PROBE
Candida species: NEGATIVE
GARDNERELLA VAGINALIS: NEGATIVE
TRICHOMONAS VAG: NEGATIVE

## 2016-07-18 LAB — IPS PAP TEST WITH REFLEX TO HPV

## 2016-07-18 NOTE — Progress Notes (Signed)
Encounter reviewed Julietta Batterman, MD   

## 2016-11-13 ENCOUNTER — Ambulatory Visit
Admission: RE | Admit: 2016-11-13 | Discharge: 2016-11-13 | Disposition: A | Discharge status: Home / Self Care | Payer: BLUE CROSS/BLUE SHIELD | Source: Ambulatory Visit | Attending: Family Medicine | Admitting: Family Medicine

## 2016-11-13 ENCOUNTER — Other Ambulatory Visit: Payer: Self-pay | Admitting: Family Medicine

## 2016-11-13 DIAGNOSIS — S8992XA Unspecified injury of left lower leg, initial encounter: Secondary | ICD-10-CM

## 2016-12-11 ENCOUNTER — Emergency Department (HOSPITAL_BASED_OUTPATIENT_CLINIC_OR_DEPARTMENT_OTHER)
Admission: EM | Admit: 2016-12-11 | Discharge: 2016-12-11 | Disposition: A | Discharge status: Home / Self Care | Payer: BLUE CROSS/BLUE SHIELD | Attending: Emergency Medicine | Admitting: Emergency Medicine

## 2016-12-11 ENCOUNTER — Encounter (HOSPITAL_BASED_OUTPATIENT_CLINIC_OR_DEPARTMENT_OTHER): Payer: Self-pay | Admitting: *Deleted

## 2016-12-11 DIAGNOSIS — M5442 Lumbago with sciatica, left side: Secondary | ICD-10-CM

## 2016-12-11 DIAGNOSIS — M62830 Muscle spasm of back: Secondary | ICD-10-CM | POA: Insufficient documentation

## 2016-12-11 DIAGNOSIS — E039 Hypothyroidism, unspecified: Secondary | ICD-10-CM | POA: Diagnosis not present

## 2016-12-11 MED ORDER — CYCLOBENZAPRINE HCL 10 MG PO TABS
10.0000 mg | ORAL_TABLET | Freq: Once | ORAL | Status: AC
  Administered 2016-12-11: 10 mg via ORAL
  Filled 2016-12-11: qty 1

## 2016-12-11 MED ORDER — NAPROXEN 500 MG PO TABS: 500.0000 mg | ORAL_TABLET | Freq: Two times a day (BID) | ORAL | 0 refills | Status: DC | PRN

## 2016-12-11 MED ORDER — NAPROXEN 250 MG PO TABS
250.0000 mg | ORAL_TABLET | Freq: Once | ORAL | Status: AC
  Administered 2016-12-11: 250 mg via ORAL
  Filled 2016-12-11: qty 1

## 2016-12-11 MED ORDER — PREDNISONE 50 MG PO TABS
60.0000 mg | ORAL_TABLET | Freq: Once | ORAL | Status: AC
  Administered 2016-12-11: 60 mg via ORAL
  Filled 2016-12-11: qty 1

## 2016-12-11 MED ORDER — PREDNISONE 20 MG PO TABS: ORAL_TABLET | ORAL | 0 refills | Status: DC

## 2016-12-11 MED ORDER — CYCLOBENZAPRINE HCL 10 MG PO TABS: 10.0000 mg | ORAL_TABLET | Freq: Three times a day (TID) | ORAL | 0 refills | Status: DC | PRN

## 2016-12-11 MED FILL — NAPROXEN 500 MG TABLET: 500 | 10 days supply | Qty: 20 | Fill #0

## 2016-12-11 MED FILL — predniSONE 20 MG TABS: 20 | 3 days supply | Qty: 9 | Fill #0

## 2016-12-11 MED FILL — CYCLOBENZAPRINE 10 MG TAB: 10 | 5 days supply | Qty: 15 | Fill #0

## 2016-12-11 NOTE — ED Provider Notes (Signed)
[facility]   CSN: [id number] Arrival date & time: 12/11/16  1334     History   Chief Complaint Chief Complaint  Patient presents with  . Back Pain    HPI Deborah Alvarez is a 65 y.o. female with a PMHx of chronic low back pain, DDD lumbar spine, sciatica, hypothyroidism, and alopecia, who presents to the ED with complaints of gradual onset left lower back pain 4 days that worsened yesterday. She describes the pain as 10/10 constant "spasmy" left lower back pain radiating into the left leg, worse with walking and movement, unrelieved with ibuprofen 400 mg and partially improved with tramadol that she had left over from a knee injury last month. She states that she's been doing a lot of twisting and bending getting ready for the upcoming holiday, but denies any heavy lifting or recent injury/fall. Associated symptoms include tingling in both buttocks and down her left leg, which she states is somewhat of a chronic issue due to her sciatica.  She denies fevers, chills, CP, SOB, abd pain, N/V/D/C, hematuria, dysuria, flank pain, saddle anesthesia/cauda equina symptoms, incontinence of urine/stool, arthralgias, numbness, focal weakness, other symptoms, or hx of CA/IVDU.    The history is provided by the patient and medical records. No language interpreter was used.  Back Pain   This is a recurrent problem. The current episode started more than 2 days ago. The problem occurs constantly. The problem has been gradually worsening. The pain is associated with twisting. The pain is present in the lumbar spine. The quality of the pain is described as cramping (spasmy). The pain radiates to the left thigh. The pain is at a severity of 10/10. The pain is severe. Exacerbated by: walking and movement. The pain is the same all the time. Associated symptoms include tingling. Pertinent negatives include no chest pain, no fever, no numbness, no abdominal pain, no bowel incontinence,  no perianal numbness, no bladder incontinence, no dysuria, no paresis and no weakness. She has tried NSAIDs (and tramadol) for the symptoms.    Past Medical History:  Diagnosis Date  . Alopecia   . Hx of abnormal Pap smear    hx severe dysplasia  . Thyroid disease    hypothyroidism    Patient Active Problem List   Diagnosis Date Noted  . Hypothyroid 07/05/2014    Class: History of    Past Surgical History:  Procedure Laterality Date  . 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 Authorizing Provider  ALPRAZolam (XANAX) 0.25 MG tablet Take 0.25 mg by mouth at bedtime as needed.  06/17/14   Historical Provider, MD  Cholecalciferol (VITAMIN D) 400 UNITS capsule Take 400 Units by mouth daily.     Historical Provider, MD  Ibuprofen (ADVIL) 200 MG CAPS Take by mouth as needed.    Historical Provider, MD  Multiple Vitamin (MULTIVITAMIN) capsule Take 1 capsule by mouth daily.    Historical Provider, MD  Naproxen Sodium (ALEVE PO) Take by mouth as needed.    Historical Provider, MD  Omega-3 Fatty Acids (FISH OIL PO) Take by  mouth daily.    Historical Provider, MD  omeprazole (PRILOSEC) 20 MG capsule Take 20 mg by mouth daily.    Historical Provider, MD  SYNTHROID 100 MCG tablet daily. 07/10/16   Historical Provider, MD    Family History Family History  Problem Relation Age of Onset  . Diabetes Father   . Congestive Heart Failure Father   . Hypertension Mother   . Breast cancer Maternal Aunt     Social History Social History  Substance Use Topics  . Smoking status: Never Smoker  . Smokeless tobacco: Never Used  . Alcohol use 2.4 - 3.0 oz/week    4 - 5 Standard drinks or equivalent per week     Allergies   Codeine  and Sulfa antibiotics   Review of Systems Review of Systems  Constitutional: Negative for chills and fever.  Respiratory: Negative for shortness of breath.   Cardiovascular: Negative for chest pain.  Gastrointestinal: Negative for abdominal pain, bowel incontinence, constipation, diarrhea, nausea and vomiting.  Genitourinary: Negative for bladder incontinence, difficulty urinating (no incontinence), dysuria, flank pain and hematuria.  Musculoskeletal: Positive for back pain. Negative for arthralgias and myalgias.  Skin: Negative for color change.  Allergic/Immunologic: Negative for immunocompromised state.  Neurological: Positive for tingling. Negative for weakness and numbness.       +tingling L leg  Psychiatric/Behavioral: Negative for confusion.   10 Systems reviewed and are negative for acute change except as noted in the HPI.   Physical Exam Updated Vital Signs BP 127/84 (BP Location: Left Arm)   Pulse 86   Temp 98 F (36.7 C) (Oral)   Resp 18   Ht 5\' 1"  (1.549 m)   Wt 68 kg   LMP 12/24/1984   SpO2 99%   BMI 28.34 kg/m   Physical Exam  Constitutional: She is oriented to person, place, and time. Vital signs are normal. She appears well-developed and well-nourished.  Non-toxic appearance. No distress.  Afebrile, nontoxic, NAD  HENT:  Head: Normocephalic and atraumatic.  Mouth/Throat: Oropharynx is clear and moist and mucous membranes are normal.  Eyes: Conjunctivae and EOM are normal. Right eye exhibits no discharge. Left eye exhibits no discharge.  Neck: Normal range of motion. Neck supple.  Cardiovascular: Normal rate, regular rhythm, normal heart sounds and intact distal pulses.  Exam reveals no gallop and no friction rub.   No murmur heard. Pulmonary/Chest: Effort normal and breath sounds normal. No respiratory distress. She has no decreased breath sounds. She has no wheezes. She has no rhonchi. She has no rales.  Abdominal: Soft. Normal appearance and bowel sounds  are normal. She exhibits no distension. There is no tenderness. There is no rigidity, no rebound, no guarding, no CVA tenderness, no tenderness at McBurney's point and negative Murphy's sign.  Soft, NTND, +BS throughout, no r/g/r, neg murphy's, neg mcburney's, no CVA TTP   Musculoskeletal: Normal range of motion.       Lumbar back: She exhibits tenderness and spasm. She exhibits normal range of motion, no bony tenderness and no deformity.       Back:  Lumbar spine with adequate ROM intact without spinous process TTP, no bony stepoffs or deformities, with mild L sided paraspinous muscle TTP and muscle spasms. Strength and sensation grossly intact in all extremities, +SLR on L side, but neg on R side, gait steady. No overlying skin changes. Distal pulses intact.   Neurological: She is alert and oriented to person, place, and time. She has normal strength. No sensory deficit.  Skin: Skin is warm, dry and intact. No rash noted.  Psychiatric: She has a normal mood and affect.  Nursing note and vitals reviewed.    ED Treatments / Results  Labs (all labs ordered are listed, but only abnormal results are displayed) Labs Reviewed - No data to display  EKG  EKG Interpretation None       Radiology No results found.  Procedures Procedures (including critical care time)  Medications Ordered in ED Medications  cyclobenzaprine (FLEXERIL) tablet 10 mg (10 mg Oral Given 12/11/16 1411)  naproxen (NAPROSYN) tablet 250 mg (250 mg Oral Given 12/11/16 1411)  predniSONE (DELTASONE) tablet 60 mg (60 mg Oral Given 12/11/16 1411)     Initial Impression / Assessment and Plan / ED Course  I have reviewed the triage vital signs and the nursing notes.  Pertinent labs & imaging results that were available during my care of the patient were reviewed by me and considered in my medical decision making (see chart for details).  Clinical Course     65 y.o. female here with low back pain on L side x4  days. On exam, no focal midline spinous process TTP, mild L paraspinous muscle TTP and spasm, +SLR on L side. No red flag s/s of low back pain. No s/s of central cord compression or cauda equina. Lower extremities are neurovascularly intact and patient is ambulating without difficulty. No urinary complaints, no flank tenderness, doubt need for U/A, or any other imaging/labs  Patient was counseled on back pain precautions and told to do activity as tolerated but do not lift, push, or pull heavy objects more than 10 pounds for the next week. Patient counseled to use ice or heat on back for no longer than 15 minutes every hour.   Rx given for muscle relaxer and counseled on proper use of muscle relaxant medication. Pt has home tramadol, discussed use of tylenol first but if severe pain she can use tramadol. Urged patient not to drink alcohol, drive, or perform any other activities that requires focus while taking either of these medications. Rx given for NSAID and prednisone.   Patient urged to follow-up with PCP if pain does not improve with treatment and rest or if pain becomes recurrent. Urged to return with worsening severe pain, loss of bowel or bladder control, trouble walking. The patient verbalizes understanding and agrees with the plan.   Final Clinical Impressions(s) / ED Diagnoses   Final diagnoses:  Acute left-sided low back pain with left-sided sciatica  Muscle spasm of back    New Prescriptions New Prescriptions   CYCLOBENZAPRINE (FLEXERIL) 10 MG TABLET    Take 1 tablet (10 mg total) by mouth 3 (three) times daily as needed for muscle spasms.   NAPROXEN (NAPROSYN) 500 MG TABLET    Take 1 tablet (500 mg total) by mouth 2 (two) times daily as needed for mild pain or moderate pain (TAKE WITH MEALS.).   PREDNISONE (DELTASONE) 20 MG TABLET    3 tabs po daily x 3 days starting 12/12/16     Allen Derry, PA-C 12/11/16 1420    Gwyneth Sprout, MD 12/11/16 1527

## 2016-12-11 NOTE — Discharge Instructions (Signed)
Back Pain: Your back pain should be treated with medicines such as ibuprofen or aleve and this back pain should get better over the next 2 weeks.  However if you develop severe or worsening pain, low back pain with fever, numbness, weakness or inability to walk or urinate, you should return to the ER immediately.  Please follow up with your doctor this week for a recheck if still having symptoms.  Avoid heavy lifting over 10 pounds over the next two weeks.  Low back pain is discomfort in the lower back that may be due to injuries to muscles and ligaments around the spine.  Occasionally, it may be caused by a a problem to a part of the spine called a disc.  The pain may last several days or a week;  However, most patients get completely well in 4 weeks.  Self - care:  The application of heat can help soothe the pain.  Maintaining your daily activities, including walking, is encourged, as it will help you get better faster than just staying in bed. Perform gentle stretching as discussed. Drink plenty of fluids.  Medications are also useful to help with pain control.  A commonly prescribed medication includes your home tramadol.  Do not drive or operate heavy machinery while taking this medication. Use additional tylenol as needed for additional pain relief.   Non steroidal anti inflammatory medications including Ibuprofen and naproxen;  These medications help both pain and swelling and are very useful in treating back pain.  They should be taken with food, as they can cause stomach upset, and more seriously, stomach bleeding.    Muscle relaxants like flexeril:  These medications can help with muscle tightness that is a cause of lower back pain.  Most of these medications can cause drowsiness, and it is not safe to drive or use dangerous machinery while taking them.  Prednisone: this will help with the sciatica symptom. Take with breakfast, starting tomorrow 12/12/16.   SEEK IMMEDIATE MEDICAL ATTENTION  IF: New numbness, tingling, weakness, or problem with the use of your arms or legs.  Severe back pain not relieved with medications.  Difficulty with or loss of control of your bowel or bladder control.  Increasing pain in any areas of the body (such as chest or abdominal pain).  Shortness of breath, dizziness or fainting.  Nausea (feeling sick to your stomach), vomiting, fever, or sweats.  You will need to follow up with  Your primary healthcare provider in 1-2 weeks for reassessment.

## 2016-12-11 NOTE — ED Triage Notes (Signed)
Back spasms on the left side x 1 day.  Hx of same.

## 2017-01-22 ENCOUNTER — Ambulatory Visit (INDEPENDENT_AMBULATORY_CARE_PROVIDER_SITE_OTHER): Payer: BLUE CROSS/BLUE SHIELD | Admitting: Sports Medicine

## 2017-01-22 ENCOUNTER — Encounter: Payer: Self-pay | Admitting: Sports Medicine

## 2017-01-22 ENCOUNTER — Ambulatory Visit (INDEPENDENT_AMBULATORY_CARE_PROVIDER_SITE_OTHER): Payer: BLUE CROSS/BLUE SHIELD

## 2017-01-22 DIAGNOSIS — M79674 Pain in right toe(s): Secondary | ICD-10-CM | POA: Diagnosis not present

## 2017-01-22 DIAGNOSIS — M2041 Other hammer toe(s) (acquired), right foot: Secondary | ICD-10-CM

## 2017-01-22 DIAGNOSIS — L84 Corns and callosities: Secondary | ICD-10-CM

## 2017-01-22 NOTE — Progress Notes (Signed)
Subjective: Deborah Alvarez is a 66 y.o. female patient who presents to office for evaluation of Right foot pain. Patient complains of progressive pain especially over the last few days in the Right foot at the 5th toe where there is a corn; states that she wore boots without socks and had pain that got better after peroxide soak; states that she also tried acid corn pad and pedicure; states that she did not like how the area looked after corn pad. Patient denies any other pedal complaints.   Patient Active Problem List   Diagnosis Date Noted  . Hypothyroid 07/05/2014    Class: History of    Current Outpatient Prescriptions on File Prior to Visit  Medication Sig Dispense Refill  . ALPRAZolam (XANAX) 0.25 MG tablet Take 0.25 mg by mouth at bedtime as needed.     . Cholecalciferol (VITAMIN D) 400 UNITS capsule Take 400 Units by mouth daily.     . cyclobenzaprine (FLEXERIL) 10 MG tablet Take 1 tablet (10 mg total) by mouth 3 (three) times daily as needed for muscle spasms. 15 tablet 0  . Ibuprofen (ADVIL) 200 MG CAPS Take by mouth as needed.    . Multiple Vitamin (MULTIVITAMIN) capsule Take 1 capsule by mouth daily.    . naproxen (NAPROSYN) 500 MG tablet Take 1 tablet (500 mg total) by mouth 2 (two) times daily as needed for mild pain or moderate pain (TAKE WITH MEALS.). 20 tablet 0  . Naproxen Sodium (ALEVE PO) Take by mouth as needed.    . Omega-3 Fatty Acids (FISH OIL PO) Take by mouth daily.    Marland Kitchen omeprazole (PRILOSEC) 20 MG capsule Take 20 mg by mouth daily.    . predniSONE (DELTASONE) 20 MG tablet 3 tabs po daily x 3 days starting 12/12/16 9 tablet 0  . SYNTHROID 100 MCG tablet daily.     No current facility-administered medications on file prior to visit.     Allergies  Allergen Reactions  . Codeine     Keeps her awake  . Sulfa Antibiotics Rash    Objective:  General: Alert and oriented x3 in no acute distress  Dermatology: Small macerated hyperkeratotic lesion overlying 5 PIPJ  dorsally on right. No open lesions bilateral lower extremities, no webspace macerations, no ecchymosis bilateral, all nails x 10 are well manicured.  Vascular: Dorsalis Pedis and Posterior Tibial pedal pulses 2/4, Capillary Fill Time 3 seconds,(+) pedal hair growth bilateral, no edema bilateral lower extremities, Temperature gradient within normal limits.  Neurology: Gross sensation intact via light touch bilateral, (-)Tinels sign bilateral.   Musculoskeletal: Semi-flexible hammertoes 2-5 with evidence of previous repair on 5th toes. No tenderness with palpation at PIPJ on right 5th toe. Ankle, Subtalar, Midtarsal, and MTPJ joint range of motion is within normal limits, there is no 1st ray hypermobility noted bilateral, No bunion deformity noted bilateral. No pain with calf compression bilateral.  Strength within normal limits in all groups bilateral.   Gait: Unassisted, Non-antalgic.  Xrays  Right Foot    Impression: Normal osseous mineralization, evidence of previous 5th toe arthroplasty, small spur at lateral 5th met head, no other acute findings.        Assessment and Plan: Problem List Items Addressed This Visit    None    Visit Diagnoses    Corns and callosities    -  Primary   Hammer toe of right foot       Toe pain, right           -  Complete examination performed -Xrays reviewed -Discussed treatement options for pain at hammertoe -Dispensed toe cap and encouraged wider shoe. Advised patient that if pain recurs then may consider local injection  -Patient to return to office as needed or sooner if condition worsens.  Landis Martins, DPM

## 2017-04-12 ENCOUNTER — Encounter: Payer: Self-pay | Admitting: Podiatry

## 2017-04-12 ENCOUNTER — Ambulatory Visit (INDEPENDENT_AMBULATORY_CARE_PROVIDER_SITE_OTHER): Payer: BLUE CROSS/BLUE SHIELD | Admitting: Podiatry

## 2017-04-12 VITALS — BP 125/85 | HR 79 | Resp 16

## 2017-04-12 DIAGNOSIS — L84 Corns and callosities: Secondary | ICD-10-CM | POA: Diagnosis not present

## 2017-04-12 DIAGNOSIS — M779 Enthesopathy, unspecified: Secondary | ICD-10-CM

## 2017-04-12 MED ORDER — TRIAMCINOLONE ACETONIDE 10 MG/ML IJ SUSP
10.0000 mg | Freq: Once | INTRAMUSCULAR | Status: AC
  Administered 2017-04-12: 10 mg

## 2017-04-12 NOTE — Progress Notes (Signed)
Subjective:     Patient ID: Nohelani Benning, female   DOB: [birth year], 66 y.o.   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

## 2017-05-09 ENCOUNTER — Telehealth: Payer: Self-pay

## 2017-05-09 NOTE — Telephone Encounter (Signed)
Left detailed message giving patient her bmd results. dpr signed to be left on mobile number. Results to be scanned in.

## 2017-05-23 ENCOUNTER — Encounter: Payer: Self-pay | Admitting: Certified Nurse Midwife

## 2017-07-25 ENCOUNTER — Encounter: Payer: Self-pay | Admitting: Certified Nurse Midwife

## 2017-07-25 ENCOUNTER — Ambulatory Visit (INDEPENDENT_AMBULATORY_CARE_PROVIDER_SITE_OTHER): Payer: BLUE CROSS/BLUE SHIELD | Admitting: Certified Nurse Midwife

## 2017-07-25 VITALS — BP 104/60 | HR 68 | Resp 16 | Ht 61.5 in | Wt 152.0 lb

## 2017-07-25 DIAGNOSIS — Z01419 Encounter for gynecological examination (general) (routine) without abnormal findings: Secondary | ICD-10-CM

## 2017-07-25 DIAGNOSIS — N951 Menopausal and female climacteric states: Secondary | ICD-10-CM

## 2017-07-25 NOTE — Patient Instructions (Signed)

## 2017-07-25 NOTE — Progress Notes (Signed)
66 y.o. [id number] Married  African American Fe here for annual exam. Menopausal no HRT, Denies vaginal bleeding or vaginal dryness. Has been using Olive oil for dryness without problems. See Dr Paulino RilyWolters PCP for aex and labs and hypothyroid management. Continues to have problems with sciatica, planning visit with Dr. Venetia MaxonStern soon for re-evaluation. Planning on ? retiring soon. No other health issues today.   Patient's last menstrual period was 12/24/1984.          Sexually active: No.  The current method of family planning is status post hysterectomy.    Exercising: Yes.    cardio, strength, flexibility Smoker:  no  Health Maintenance: Pap:  07-05-14 neg HPV HR neg, 07-17-16 neg History of Abnormal Pap: yes MMG:  05-01-17 category a density birads 2:neg Self Breast exams: yes Colonoscopy:  3/15 polyps f/u 3917yrs BMD:  2018  TDaP:  2016 Shingles: 2014 Pneumonia: no Hep C and HIV: Hep c neg 2017 Labs: none   reports that she has never smoked. She has never used smokeless tobacco. She reports that she drinks about 1.8 - 3.0 oz of alcohol per week . She reports that she does not use drugs.  Past Medical History:  Diagnosis Date  . Alopecia   . Hx of abnormal Pap smear    hx severe dysplasia  . Thyroid disease    hypothyroidism    Past Surgical History:  Procedure Laterality Date  . 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 ALPRAZolam (XANAX) 0.25 MG tablet Take 0.25 mg by mouth at bedtime as needed.     . Cholecalciferol (VITAMIN D) 400 UNITS capsule Take 400 Units by mouth daily.     . Ibuprofen (ADVIL) 200 MG CAPS Take by mouth as needed.    . Multiple Vitamin (MULTIVITAMIN) capsule Take 1 capsule by mouth daily.    . Omega-3 Fatty Acids (FISH OIL PO) Take by mouth daily.    Marland Kitchen. omeprazole (PRILOSEC) 20 MG capsule Take 20 mg by mouth daily.    Marland Kitchen. SYNTHROID 100 MCG tablet daily.      No current facility-administered medications for this visit.     Family History  Problem Relation Age of Onset  . Diabetes Father   . Congestive Heart Failure Father   . Hypertension Mother   . Breast cancer Maternal Aunt     ROS:  Pertinent items are noted in HPI.  Otherwise, a comprehensive ROS was negative.  Exam:   BP 104/60   Pulse 68   Resp 16   Ht 5' 1.5" (1.562 m)   Wt 152 lb (68.9 kg)   LMP 12/24/1984   BMI 28.26 kg/m  Height: 5' 1.5" (156.2 cm) Ht Readings from Last 3 Encounters:  07/25/17 5' 1.5" (1.562 m)  12/11/16 5\' 1"  (1.549 m)  07/17/16 5' 1.25" (1.556 m)    General appearance: alert, cooperative and appears stated age Head: Normocephalic, without obvious abnormality, atraumatic Neck: no adenopathy, supple, symmetrical, trachea midline and thyroid normal to inspection and palpation Lungs: clear to auscultation bilaterally Breasts: normal appearance, no masses or tenderness, No nipple retraction or dimpling, No nipple discharge or bleeding, No axillary or supraclavicular adenopathy Heart: regular rate and rhythm Abdomen: soft, non-tender; no masses,  no organomegaly Extremities: extremities normal, atraumatic, no cyanosis or edema Skin: Skin color, texture, turgor normal. No rashes or lesions Lymph nodes: Cervical, supraclavicular, and axillary nodes normal. No  abnormal inguinal nodes palpated Neurologic: Grossly normal   Pelvic: External genitalia:  no lesions              Urethra:  normal appearing urethra with no masses, tenderness or lesions              Bartholin's and Skene's: normal                 Vagina: normal appearing vagina with normal color and discharge, no lesions              Cervix: absent              Pap taken: No. 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 Questions addressed  Continue follow up with PCP as indicated  Pap smear: no   counseled on breast self exam, mammography screening, adequate intake of calcium and vitamin D, diet and exercise, Kegel's exercises  return annually or prn  An After Visit Summary was printed and given to the patient.

## 2017-11-19 DIAGNOSIS — R079 Chest pain, unspecified: Secondary | ICD-10-CM | POA: Diagnosis not present

## 2017-12-01 IMAGING — CR DG KNEE COMPLETE 4+V*L*
1 series · 1 of 1 positions shown · non-contrast
Comparison: None.

CLINICAL DATA: Fall 1 week ago with medial knee pain, initial
encounter

EXAM:
LEFT KNEE - COMPLETE 4+ VIEW

[view not recorded]
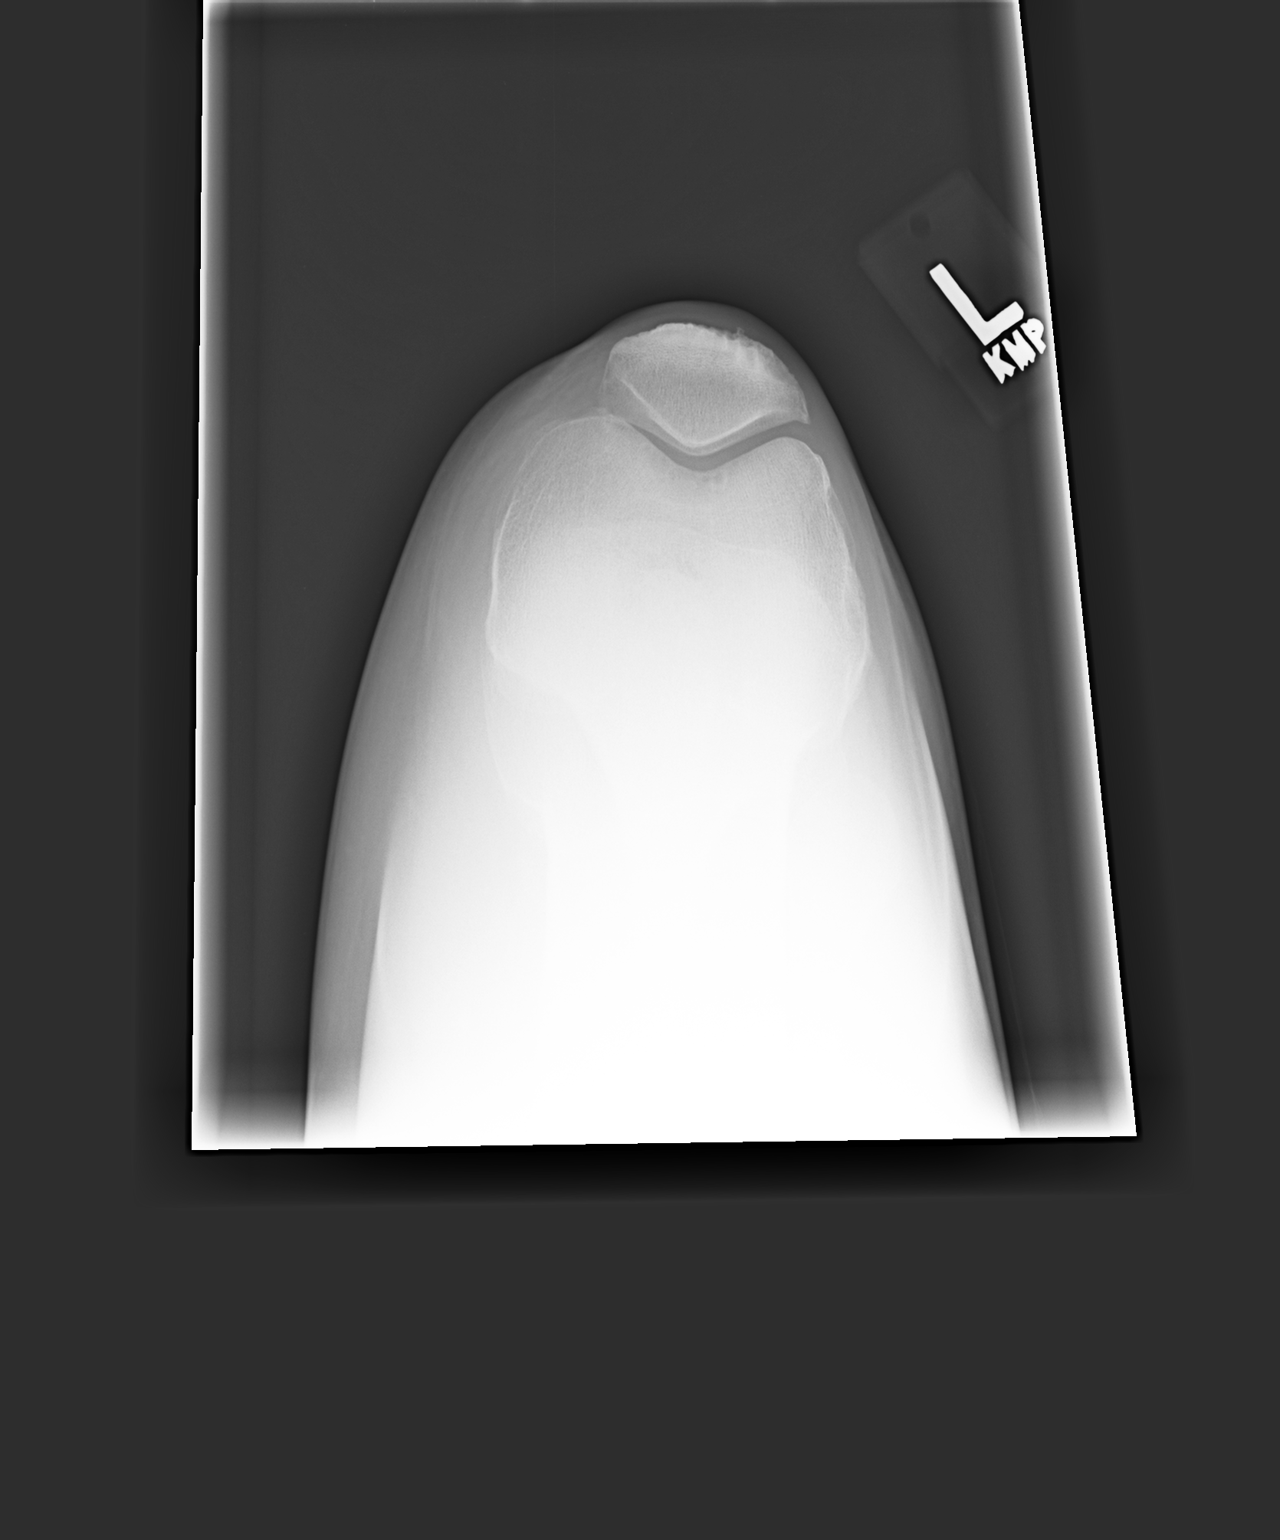

[1 of 1 positions shown; findings below may reference images not displayed]

FINDINGS: Very minimal degenerative changes are noted in the patellofemoral
space. No acute fracture or dislocation is seen. No joint effusion
is noted.
IMPRESSION: Minimal degenerative change without acute abnormality.

## 2018-01-28 DIAGNOSIS — R05 Cough: Secondary | ICD-10-CM | POA: Diagnosis not present

## 2018-05-02 DIAGNOSIS — Z1231 Encounter for screening mammogram for malignant neoplasm of breast: Secondary | ICD-10-CM | POA: Diagnosis not present

## 2018-05-15 DIAGNOSIS — E039 Hypothyroidism, unspecified: Secondary | ICD-10-CM | POA: Diagnosis not present

## 2018-05-22 DIAGNOSIS — E039 Hypothyroidism, unspecified: Secondary | ICD-10-CM | POA: Diagnosis not present

## 2018-05-28 ENCOUNTER — Ambulatory Visit: Payer: BLUE CROSS/BLUE SHIELD | Admitting: Certified Nurse Midwife

## 2018-05-28 ENCOUNTER — Other Ambulatory Visit: Payer: Self-pay

## 2018-05-28 ENCOUNTER — Encounter: Payer: Self-pay | Admitting: Certified Nurse Midwife

## 2018-05-28 VITALS — BP 110/68 | HR 68 | Resp 16 | Ht 61.5 in | Wt 148.0 lb

## 2018-05-28 DIAGNOSIS — N951 Menopausal and female climacteric states: Secondary | ICD-10-CM

## 2018-05-28 DIAGNOSIS — Z780 Asymptomatic menopausal state: Secondary | ICD-10-CM

## 2018-05-28 DIAGNOSIS — Z01419 Encounter for gynecological examination (general) (routine) without abnormal findings: Secondary | ICD-10-CM

## 2018-05-28 NOTE — Progress Notes (Signed)
67 y.o. [id number] Married  African American Fe here for annual exam. Menopausal no HRT. Sees Dr. Paulino RilyWolters PCP for aex, labs yearly and hypothyroid management. All stable per patient. Continues with vaginal dryness, but coconut oil working well. Adjusting to change in relationship with marriage and intimacy, but no issues. Enjoying grandson! No health issues today.  Patient's last menstrual period was 12/24/1984.          Sexually active: Yes.    The current method of family planning is status post hysterectomy.    Exercising: Yes.    cardio, strength, flexibility Smoker:  no  Health Maintenance: Pap:  07-17-16 neg History of Abnormal Pap: yes MMG:  5/19 neg per patient, to sign release Self Breast exams: yes Colonoscopy: 2015 polyps f/u 4847yrs BMD:   2018 Osteopenia, managed by PCP TDaP:  2016 Shingles: 2014 Pneumonia: no Hep C and HIV: hep c neg 2017 per patient Labs: with PCP   reports that she has never smoked. She has never used smokeless tobacco. She reports that she drinks alcohol. She reports that she does not use drugs.  Past Medical History:  Diagnosis Date  . Alopecia   . Hx of abnormal Pap smear    hx severe dysplasia  . Thyroid disease    hypothyroidism    Past Surgical History:  Procedure Laterality Date  . ABDOMINAL HYSTERECTOMY  1986   TAH(still has ovaries)--d/t severe dysplasia    Current Outpatient Medications  Medication Sig Dispense Refill  . ALPRAZolam (XANAX) 0.25 MG tablet Take 0.25 mg by mouth at bedtime as needed.     . Cholecalciferol (VITAMIN D) 400 UNITS capsule Take 400 Units by mouth daily.     . Ibuprofen (ADVIL) 200 MG CAPS Take by mouth as needed.    . Multiple Vitamin (MULTIVITAMIN) capsule Take 1 capsule by mouth daily.    . Omega-3 Fatty Acids (FISH OIL PO) Take by mouth daily.    Marland Kitchen. omeprazole (PRILOSEC) 20 MG capsule Take 20 mg by mouth daily.    Marland Kitchen. SYNTHROID 88 MCG tablet Take 88 mcg by mouth daily.  4   No current facility-administered  medications for this visit.     Family History  Problem Relation Age of Onset  . Diabetes Father   . Congestive Heart Failure Father   . Hypertension Mother   . Breast cancer Maternal Aunt     ROS:  Pertinent items are noted in HPI.  Otherwise, a comprehensive ROS was negative.  Exam:   BP 110/68   Pulse 68   Resp 16   Ht 5' 1.5" (1.562 m)   Wt 148 lb (67.1 kg)   LMP 12/24/1984   BMI 27.51 kg/m  Height: 5' 1.5" (156.2 cm) Ht Readings from Last 3 Encounters:  05/28/18 5' 1.5" (1.562 m)  07/25/17 5' 1.5" (1.562 m)  12/11/16 5\' 1"  (1.549 m)    General appearance: alert, cooperative and appears stated age Head: Normocephalic, without obvious abnormality, atraumatic Neck: no adenopathy, supple, symmetrical, trachea midline and thyroid normal to inspection and palpation Lungs: clear to auscultation bilaterally Breasts: normal appearance, no masses or tenderness, No nipple retraction or dimpling, No nipple discharge or bleeding, No axillary or supraclavicular adenopathy Heart: regular rate and rhythm Abdomen: soft, non-tender; no masses,  no organomegaly Extremities: extremities normal, atraumatic, no cyanosis or edema Skin: Skin color, texture, turgor normal. No rashes or lesions Lymph nodes: Cervical, supraclavicular, and axillary nodes normal. No abnormal inguinal nodes palpated Neurologic: Grossly normal  Pelvic: External genitalia:  no lesions              Urethra:  normal appearing urethra with no masses, tenderness or lesions              Bartholin's and Skene's: normal                 Vagina: normal appearing vagina with normal color and discharge, no lesions              Cervix: absent              Pap taken: No. Bimanual Exam:  Uterus:  uterus absent              Adnexa: normal adnexa and no mass, fullness, tenderness               Rectovaginal: Confirms               Anus:  normal sphincter tone, no lesions  Chaperone present: yes  A:  Well Woman with  normal exam  Post menopausal no HRT  Vaginal dryness coconut oil working well  Hypothyroid with PCP management  P:   Reviewed health and wellness pertinent to exam  Aware to advise if vaginal bleeding or dryness changes  Continue with PCP follow up as indicated  Pap smear: no   counseled on breast self exam, mammography screening, feminine hygiene, adequate intake of calcium and vitamin D, diet and exercise  return annually or prn  An After Visit Summary was printed and given to the patient.

## 2018-05-28 NOTE — Patient Instructions (Signed)

## 2018-06-03 ENCOUNTER — Encounter: Payer: Self-pay | Admitting: Certified Nurse Midwife

## 2018-06-25 DIAGNOSIS — Z79899 Other long term (current) drug therapy: Secondary | ICD-10-CM | POA: Diagnosis not present

## 2018-06-25 DIAGNOSIS — M8580 Other specified disorders of bone density and structure, unspecified site: Secondary | ICD-10-CM | POA: Diagnosis not present

## 2018-06-30 DIAGNOSIS — Z23 Encounter for immunization: Secondary | ICD-10-CM | POA: Diagnosis not present

## 2018-06-30 DIAGNOSIS — Z0000 Encounter for general adult medical examination without abnormal findings: Secondary | ICD-10-CM | POA: Diagnosis not present

## 2018-07-22 DIAGNOSIS — E039 Hypothyroidism, unspecified: Secondary | ICD-10-CM | POA: Diagnosis not present

## 2018-09-02 DIAGNOSIS — Z23 Encounter for immunization: Secondary | ICD-10-CM | POA: Diagnosis not present

## 2018-10-07 DIAGNOSIS — L918 Other hypertrophic disorders of the skin: Secondary | ICD-10-CM | POA: Diagnosis not present

## 2019-01-19 DIAGNOSIS — R55 Syncope and collapse: Secondary | ICD-10-CM | POA: Diagnosis not present

## 2019-01-19 DIAGNOSIS — R42 Dizziness and giddiness: Secondary | ICD-10-CM | POA: Diagnosis not present

## 2019-05-20 DIAGNOSIS — Z803 Family history of malignant neoplasm of breast: Secondary | ICD-10-CM | POA: Diagnosis not present

## 2019-05-20 DIAGNOSIS — Z1231 Encounter for screening mammogram for malignant neoplasm of breast: Secondary | ICD-10-CM | POA: Diagnosis not present

## 2019-06-03 NOTE — Progress Notes (Signed)
68 y.o. Q6P6195 Married  African American Fe here for annual exam. Post menopausal, no vaginal bleeding. Using coconut oil with good results  vaginal dryness. Has had some flared pain in her right shoulder with some exercise with music, resolving. Has wellness visit with Dr. Stephanie Acre with labs. Dr. Chalmers Cater hypothyroid management with Synthroid increased.. No health issues today.  Patient's last menstrual period was 12/24/1984.          Sexually active: Yes.    The current method of family planning is status post hysterectomy.    Exercising: Yes.    walking Smoker:  no  Review of Systems  Constitutional: Negative.   HENT: Negative.   Eyes: Negative.   Respiratory: Negative.   Cardiovascular: Negative.   Gastrointestinal: Negative.   Genitourinary: Negative.   Musculoskeletal: Negative.   Skin: Negative.   Neurological: Negative.   Endo/Heme/Allergies: Negative.   Psychiatric/Behavioral: Negative.     Health Maintenance: Pap:  07-17-16 neg History of Abnormal Pap: yes MMG:  05-02-18 category a density birads 1:neg, 05-20-2019 neg waiting on fax Self Breast exams: yes Colonoscopy:  2015 polyps f/u 58yrs per patient BMD:   2018 osteopenia, managed by PCP TDaP:  2016 Shingles: 2014, 2019 Pneumonia: not done Hep C and HIV: Hep c neg 2017 per patient Labs: with PCP.   reports that she has never smoked. She has never used smokeless tobacco. She reports current alcohol use. She reports that she does not use drugs.  Past Medical History:  Diagnosis Date  . Alopecia   . Hx of abnormal Pap smear    hx severe dysplasia  . Thyroid disease    hypothyroidism    Past Surgical History:  Procedure Laterality Date  . ABDOMINAL HYSTERECTOMY  1986   TAH(still has ovaries)--d/t severe dysplasia    Current Outpatient Medications  Medication Sig Dispense Refill  . Ascorbic Acid (VITAMIN C PO) Take by mouth.    . Cholecalciferol (VITAMIN D) 400 UNITS capsule Take 400 Units by mouth daily.      . Multiple Vitamin (MULTIVITAMIN) capsule Take 1 capsule by mouth daily.    . naproxen sodium (ALEVE) 220 MG tablet Take 220 mg by mouth.    . Omega-3 Fatty Acids (FISH OIL PO) Take by mouth daily.    Marland Kitchen omeprazole (PRILOSEC) 20 MG capsule Take 20 mg by mouth daily.    Marland Kitchen SYNTHROID 100 MCG tablet TAKE 1 TABLET BY MOUTH EVERY MORNING ON AN EMPTY STOMACH *INSURANCE WILL ONLY PAY FOR 90*    . UNABLE TO FIND chia seeds     No current facility-administered medications for this visit.     Family History  Problem Relation Age of Onset  . Diabetes Father   . Congestive Heart Failure Father   . Hypertension Mother   . Breast cancer Maternal Aunt     ROS:  Pertinent items are noted in HPI.  Otherwise, a comprehensive ROS was negative.  Exam:   BP 118/78   Pulse 68   Temp 98 F (36.7 C) (Skin)   Resp 16   Ht 5' 1.5" (1.562 m)   Wt 156 lb (70.8 kg)   LMP 12/24/1984   BMI 29.00 kg/m  Height: 5' 1.5" (156.2 cm) Ht Readings from Last 3 Encounters:  06/05/19 5' 1.5" (1.562 m)  05/28/18 5' 1.5" (1.562 m)  07/25/17 5' 1.5" (1.562 m)    General appearance: alert, cooperative and appears stated age Head: Normocephalic, without obvious abnormality, atraumatic Neck: no adenopathy, supple, symmetrical,  trachea midline and thyroid normal to inspection and palpation Lungs: clear to auscultation bilaterally Breasts: normal appearance, no masses or tenderness, No nipple retraction or dimpling, No nipple discharge or bleeding, No axillary or supraclavicular adenopathy Heart: regular rate and rhythm Abdomen: soft, non-tender; no masses,  no organomegaly Extremities: extremities normal, atraumatic, no cyanosis or edema Skin: Skin color, texture, turgor normal. No rashes or lesions Lymph nodes: Cervical, supraclavicular, and axillary nodes normal. No abnormal inguinal nodes palpated Neurologic: Grossly normal   Pelvic: External genitalia:  no lesions              Urethra:  normal appearing  urethra with no masses, tenderness or lesions              Bartholin's and Skene's: normal                 Vagina: normal appearing vagina with normal color and discharge, no lesions              Cervix: absent              Pap taken: No. Bimanual Exam:  Uterus:  uterus absent              Adnexa: normal adnexa and no mass, fullness, tenderness               Rectovaginal: Confirms               Anus:  normal sphincter tone, no lesions  Chaperone present: yes  A:  Well Woman with normal exam  Post menopausal  No HRT s/p TAH ovaries retained  Vaginal dryness responding well to coconut oil  Hypothyroid with MD management, change in dosage, under follow up  P:   Reviewed health and wellness pertinent to exam  Aware of need to advise if vaginal bleeding with vaginal dryness. Continue coconut oil use.  Continue MD follow up as indicated  Pap smear: no   counseled on breast self exam, mammography screening, feminine hygiene, adequate intake of calcium and vitamin D, diet and exercise  return annually or prn  An After Visit Summary was printed and given to the patient.

## 2019-06-04 ENCOUNTER — Ambulatory Visit: Payer: BLUE CROSS/BLUE SHIELD | Admitting: Certified Nurse Midwife

## 2019-06-05 ENCOUNTER — Other Ambulatory Visit: Payer: Self-pay

## 2019-06-05 ENCOUNTER — Ambulatory Visit: Payer: BC Managed Care – PPO | Admitting: Certified Nurse Midwife

## 2019-06-05 ENCOUNTER — Encounter: Payer: Self-pay | Admitting: Certified Nurse Midwife

## 2019-06-05 VITALS — BP 118/78 | HR 68 | Temp 98.0°F | Resp 16 | Ht 61.5 in | Wt 156.0 lb

## 2019-06-05 DIAGNOSIS — Z01419 Encounter for gynecological examination (general) (routine) without abnormal findings: Secondary | ICD-10-CM

## 2019-07-16 DIAGNOSIS — Z90710 Acquired absence of both cervix and uterus: Secondary | ICD-10-CM | POA: Diagnosis not present

## 2019-07-16 DIAGNOSIS — M8589 Other specified disorders of bone density and structure, multiple sites: Secondary | ICD-10-CM | POA: Diagnosis not present

## 2019-08-03 DIAGNOSIS — M8580 Other specified disorders of bone density and structure, unspecified site: Secondary | ICD-10-CM | POA: Diagnosis not present

## 2019-08-03 DIAGNOSIS — Z0000 Encounter for general adult medical examination without abnormal findings: Secondary | ICD-10-CM | POA: Diagnosis not present

## 2019-08-03 DIAGNOSIS — M899 Disorder of bone, unspecified: Secondary | ICD-10-CM | POA: Diagnosis not present

## 2019-08-03 DIAGNOSIS — Z79899 Other long term (current) drug therapy: Secondary | ICD-10-CM | POA: Diagnosis not present

## 2019-08-03 DIAGNOSIS — Z8249 Family history of ischemic heart disease and other diseases of the circulatory system: Secondary | ICD-10-CM | POA: Diagnosis not present

## 2019-08-03 DIAGNOSIS — Z9189 Other specified personal risk factors, not elsewhere classified: Secondary | ICD-10-CM | POA: Diagnosis not present

## 2019-08-03 DIAGNOSIS — E039 Hypothyroidism, unspecified: Secondary | ICD-10-CM | POA: Diagnosis not present

## 2019-08-03 DIAGNOSIS — Z13220 Encounter for screening for lipoid disorders: Secondary | ICD-10-CM | POA: Diagnosis not present

## 2019-08-06 DIAGNOSIS — Z0000 Encounter for general adult medical examination without abnormal findings: Secondary | ICD-10-CM | POA: Diagnosis not present

## 2019-08-06 DIAGNOSIS — Z23 Encounter for immunization: Secondary | ICD-10-CM | POA: Diagnosis not present

## 2019-09-22 DIAGNOSIS — Z23 Encounter for immunization: Secondary | ICD-10-CM | POA: Diagnosis not present

## 2019-11-18 DIAGNOSIS — M25551 Pain in right hip: Secondary | ICD-10-CM | POA: Diagnosis not present

## 2019-11-18 DIAGNOSIS — M4807 Spinal stenosis, lumbosacral region: Secondary | ICD-10-CM | POA: Diagnosis not present

## 2019-11-18 DIAGNOSIS — M25552 Pain in left hip: Secondary | ICD-10-CM | POA: Diagnosis not present

## 2019-11-18 DIAGNOSIS — R030 Elevated blood-pressure reading, without diagnosis of hypertension: Secondary | ICD-10-CM | POA: Diagnosis not present

## 2019-12-02 ENCOUNTER — Other Ambulatory Visit: Payer: Self-pay | Admitting: Neurosurgery

## 2019-12-02 DIAGNOSIS — M5416 Radiculopathy, lumbar region: Secondary | ICD-10-CM

## 2019-12-24 DIAGNOSIS — R030 Elevated blood-pressure reading, without diagnosis of hypertension: Secondary | ICD-10-CM | POA: Diagnosis not present

## 2019-12-26 ENCOUNTER — Other Ambulatory Visit: Payer: BLUE CROSS/BLUE SHIELD

## 2020-03-15 ENCOUNTER — Encounter: Payer: Self-pay | Admitting: Certified Nurse Midwife

## 2020-06-06 ENCOUNTER — Other Ambulatory Visit: Payer: Self-pay

## 2020-06-06 NOTE — Progress Notes (Signed)
69 y.o. G28P3003 Married [place] American female here for annual exam.  Long term patient of Debbi Darcel Bayley.  Denies vaginal bleeding.  Hysterectomy done around age 63 due to dysplasia.  Follow up pap smears have been normal.  She did have neg HR HPV testing in 2015.    Sees Dr. Talmage Nap.  H/O hypothyroidism.  Had recent adjustment in dosage due to lab work.  Has follow up 07/26/2020 and will have repeat blood work.  Patient's last menstrual period was 12/24/1984.          Sexually active: No.  The current method of family planning is status post hysterectomy.    Exercising: Yes.    cardio, weights, resistance Smoker:  no  Health Maintenance: Pap:  07-05-14 neg HPV HR neg, 07-17-16 neg History of abnormal Pap:  yes MMG:  05-25-2020 neg per patient, patient to have it faxed to [place] Colonoscopy:  2015 polyps f/u 36yrs, Dr. Loreta Ave BMD:   2018 osteopenia, managed by PCP TDaP:  2016 Pneumonia vaccine(s):  Had done Shingrix:   2014, 2019 x 2 Hep C testing: hep c neg 2017 per patient Screening Labs: does with Dr. Paulino Rily   reports that she has never smoked. She has never used smokeless tobacco. She reports current alcohol use. She reports that she does not use drugs.  Past Medical History:  Diagnosis Date  . Alopecia   . Hx of abnormal Pap smear    hx severe dysplasia  . Thyroid disease    hypothyroidism    Past Surgical History:  Procedure Laterality Date  . ABDOMINAL HYSTERECTOMY  1986   TAH(still has ovaries)--d/t severe dysplasia    Current Outpatient Medications  Medication Sig Dispense Refill  . Ascorbic Acid (VITAMIN C PO) Take by mouth.    . Calcium Carbonate Antacid (TUMS PO) Take by mouth as needed.    . Cholecalciferol (VITAMIN D) 400 UNITS capsule Take 400 Units by mouth daily.     . Multiple Vitamin (MULTIVITAMIN) capsule Take 1 capsule by mouth daily.    . naproxen sodium (ALEVE) 220 MG tablet Take 220 mg by mouth.    . Omega-3 Fatty Acids (FISH OIL PO) Take by mouth  daily.    Marland Kitchen omeprazole (PRILOSEC) 20 MG capsule Take 20 mg by mouth daily.    Marland Kitchen SYNTHROID 100 MCG tablet TAKE 1 TABLET BY MOUTH EVERY MORNING ON AN EMPTY STOMACH *INSURANCE WILL ONLY PAY FOR 90*    . UNABLE TO FIND chia seeds     No current facility-administered medications for this visit.    Family History  Problem Relation Age of Onset  . Diabetes Father   . Congestive Heart Failure Father   . Hypertension Mother   . Breast cancer Maternal Aunt     Review of Systems  Constitutional: Negative.   HENT: Negative.   Eyes: Negative.   Respiratory: Negative.   Cardiovascular: Negative.   Gastrointestinal: Negative.   Endocrine: Negative.   Genitourinary: Negative for difficulty urinating.       Vaginal dryness  Musculoskeletal: Negative.   Skin: Negative.   Allergic/Immunologic: Negative.   Neurological: Negative.   Hematological: Negative.   Psychiatric/Behavioral: Negative.     Exam:   BP 120/78   Pulse 70   Temp (!) 97.2 F (36.2 C) (Skin)   Resp 16   Ht 5' 1.25" (1.556 m)   Wt 155 lb (70.3 kg)   LMP 12/24/1984   BMI 29.05 kg/m   Height: 5' 1.25" (  155.6 cm)  General appearance: alert, cooperative and appears stated age Head: Normocephalic, without obvious abnormality, atraumatic Neck: no adenopathy, supple, symmetrical, trachea midline and thyroid normal to inspection and palpation Lungs: clear to auscultation bilaterally Breasts: normal appearance, no masses or tenderness Heart: regular rate and rhythm Abdomen: soft, non-tender; bowel sounds normal; no masses,  no organomegaly Extremities: extremities normal, atraumatic, no cyanosis or edema Skin: Skin color, texture, turgor normal. No rashes or lesions Lymph nodes: Cervical, supraclavicular, and axillary nodes normal. No abnormal inguinal nodes palpated Neurologic: Grossly normal   Pelvic: External genitalia:  no lesions              Urethra:  normal appearing urethra with no masses, tenderness or  lesions              Bartholins and Skenes: normal                 Vagina: normal appearing vagina with normal color and discharge, no lesions              Cervix: absent              Pap taken: No. Bimanual Exam:  Uterus:  not examined              Adnexa: no mass, fullness, tenderness               Rectovaginal: Confirms               Anus:  normal sphincter tone, no lesions  Chaperone, Terence Lux, CMA, was present for exam.  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 Stephanie Acre Vaccines are UTD, reviewed with pt today Return annually or prn

## 2020-06-07 ENCOUNTER — Encounter: Payer: Self-pay | Admitting: Obstetrics & Gynecology

## 2020-06-07 ENCOUNTER — Ambulatory Visit: Payer: BC Managed Care – PPO | Admitting: Certified Nurse Midwife

## 2020-06-07 ENCOUNTER — Ambulatory Visit (INDEPENDENT_AMBULATORY_CARE_PROVIDER_SITE_OTHER): Payer: Medicare Other | Admitting: Obstetrics & Gynecology

## 2020-06-07 VITALS — BP 120/78 | HR 70 | Temp 97.2°F | Resp 16 | Ht 61.25 in | Wt 155.0 lb

## 2020-06-07 DIAGNOSIS — Z01419 Encounter for gynecological examination (general) (routine) without abnormal findings: Secondary | ICD-10-CM

## 2021-01-25 DIAGNOSIS — G479 Sleep disorder, unspecified: Secondary | ICD-10-CM | POA: Diagnosis not present

## 2021-01-25 DIAGNOSIS — N3000 Acute cystitis without hematuria: Secondary | ICD-10-CM | POA: Diagnosis not present

## 2021-01-25 DIAGNOSIS — M48061 Spinal stenosis, lumbar region without neurogenic claudication: Secondary | ICD-10-CM | POA: Diagnosis not present

## 2021-01-25 DIAGNOSIS — M79645 Pain in left finger(s): Secondary | ICD-10-CM | POA: Diagnosis not present

## 2021-02-03 DIAGNOSIS — M5416 Radiculopathy, lumbar region: Secondary | ICD-10-CM | POA: Diagnosis not present

## 2021-02-03 DIAGNOSIS — M545 Low back pain, unspecified: Secondary | ICD-10-CM | POA: Diagnosis not present

## 2021-02-03 DIAGNOSIS — M5136 Other intervertebral disc degeneration, lumbar region: Secondary | ICD-10-CM | POA: Diagnosis not present

## 2021-02-03 DIAGNOSIS — M4807 Spinal stenosis, lumbosacral region: Secondary | ICD-10-CM | POA: Diagnosis not present

## 2021-02-03 DIAGNOSIS — M5126 Other intervertebral disc displacement, lumbar region: Secondary | ICD-10-CM | POA: Diagnosis not present

## 2021-02-03 DIAGNOSIS — M25551 Pain in right hip: Secondary | ICD-10-CM | POA: Diagnosis not present

## 2021-02-21 DIAGNOSIS — M5416 Radiculopathy, lumbar region: Secondary | ICD-10-CM | POA: Diagnosis not present

## 2021-02-21 DIAGNOSIS — Z7689 Persons encountering health services in other specified circumstances: Secondary | ICD-10-CM | POA: Diagnosis not present

## 2021-02-21 DIAGNOSIS — M545 Low back pain, unspecified: Secondary | ICD-10-CM | POA: Diagnosis not present

## 2021-03-20 DIAGNOSIS — M545 Low back pain, unspecified: Secondary | ICD-10-CM | POA: Diagnosis not present

## 2021-03-20 DIAGNOSIS — M5136 Other intervertebral disc degeneration, lumbar region: Secondary | ICD-10-CM | POA: Diagnosis not present

## 2021-03-20 DIAGNOSIS — R030 Elevated blood-pressure reading, without diagnosis of hypertension: Secondary | ICD-10-CM | POA: Diagnosis not present

## 2021-03-20 DIAGNOSIS — M4807 Spinal stenosis, lumbosacral region: Secondary | ICD-10-CM | POA: Diagnosis not present

## 2021-03-20 DIAGNOSIS — M5416 Radiculopathy, lumbar region: Secondary | ICD-10-CM | POA: Diagnosis not present

## 2021-05-23 DIAGNOSIS — J40 Bronchitis, not specified as acute or chronic: Secondary | ICD-10-CM | POA: Diagnosis not present

## 2021-05-23 DIAGNOSIS — J329 Chronic sinusitis, unspecified: Secondary | ICD-10-CM | POA: Diagnosis not present

## 2021-05-23 DIAGNOSIS — J029 Acute pharyngitis, unspecified: Secondary | ICD-10-CM | POA: Diagnosis not present

## 2021-05-23 DIAGNOSIS — R059 Cough, unspecified: Secondary | ICD-10-CM | POA: Diagnosis not present

## 2021-05-29 DIAGNOSIS — M25561 Pain in right knee: Secondary | ICD-10-CM | POA: Diagnosis not present

## 2021-05-29 DIAGNOSIS — M5416 Radiculopathy, lumbar region: Secondary | ICD-10-CM | POA: Diagnosis not present

## 2021-05-30 DIAGNOSIS — E039 Hypothyroidism, unspecified: Secondary | ICD-10-CM | POA: Diagnosis not present

## 2021-05-31 DIAGNOSIS — Z780 Asymptomatic menopausal state: Secondary | ICD-10-CM | POA: Diagnosis not present

## 2021-05-31 DIAGNOSIS — Z1231 Encounter for screening mammogram for malignant neoplasm of breast: Secondary | ICD-10-CM | POA: Diagnosis not present

## 2021-05-31 DIAGNOSIS — M85851 Other specified disorders of bone density and structure, right thigh: Secondary | ICD-10-CM | POA: Diagnosis not present

## 2021-06-05 DIAGNOSIS — E039 Hypothyroidism, unspecified: Secondary | ICD-10-CM | POA: Diagnosis not present

## 2021-07-21 ENCOUNTER — Ambulatory Visit: Payer: Medicare Other

## 2021-08-02 DIAGNOSIS — E559 Vitamin D deficiency, unspecified: Secondary | ICD-10-CM | POA: Diagnosis not present

## 2021-08-02 DIAGNOSIS — E781 Pure hyperglyceridemia: Secondary | ICD-10-CM | POA: Diagnosis not present

## 2021-08-02 DIAGNOSIS — Z79899 Other long term (current) drug therapy: Secondary | ICD-10-CM | POA: Diagnosis not present

## 2021-08-02 DIAGNOSIS — R7401 Elevation of levels of liver transaminase levels: Secondary | ICD-10-CM | POA: Diagnosis not present

## 2021-08-02 DIAGNOSIS — E039 Hypothyroidism, unspecified: Secondary | ICD-10-CM | POA: Diagnosis not present

## 2021-08-04 DIAGNOSIS — M48061 Spinal stenosis, lumbar region without neurogenic claudication: Secondary | ICD-10-CM | POA: Diagnosis not present

## 2021-08-04 DIAGNOSIS — G4700 Insomnia, unspecified: Secondary | ICD-10-CM | POA: Diagnosis not present

## 2021-08-04 DIAGNOSIS — Z23 Encounter for immunization: Secondary | ICD-10-CM | POA: Diagnosis not present

## 2021-08-04 DIAGNOSIS — K219 Gastro-esophageal reflux disease without esophagitis: Secondary | ICD-10-CM | POA: Diagnosis not present

## 2021-08-04 DIAGNOSIS — Z79899 Other long term (current) drug therapy: Secondary | ICD-10-CM | POA: Diagnosis not present

## 2021-08-04 DIAGNOSIS — E039 Hypothyroidism, unspecified: Secondary | ICD-10-CM | POA: Diagnosis not present

## 2021-08-04 DIAGNOSIS — E559 Vitamin D deficiency, unspecified: Secondary | ICD-10-CM | POA: Diagnosis not present

## 2021-08-04 DIAGNOSIS — E781 Pure hyperglyceridemia: Secondary | ICD-10-CM | POA: Diagnosis not present

## 2021-08-04 DIAGNOSIS — M8580 Other specified disorders of bone density and structure, unspecified site: Secondary | ICD-10-CM | POA: Diagnosis not present

## 2021-08-04 DIAGNOSIS — R7401 Elevation of levels of liver transaminase levels: Secondary | ICD-10-CM | POA: Diagnosis not present

## 2021-08-04 DIAGNOSIS — Z0000 Encounter for general adult medical examination without abnormal findings: Secondary | ICD-10-CM | POA: Diagnosis not present

## 2021-08-31 DIAGNOSIS — H25013 Cortical age-related cataract, bilateral: Secondary | ICD-10-CM | POA: Diagnosis not present

## 2021-08-31 DIAGNOSIS — H2513 Age-related nuclear cataract, bilateral: Secondary | ICD-10-CM | POA: Diagnosis not present

## 2021-08-31 DIAGNOSIS — H5203 Hypermetropia, bilateral: Secondary | ICD-10-CM | POA: Diagnosis not present

## 2021-09-13 DIAGNOSIS — L2081 Atopic neurodermatitis: Secondary | ICD-10-CM | POA: Diagnosis not present

## 2021-09-19 ENCOUNTER — Encounter: Payer: Self-pay | Admitting: Nurse Practitioner

## 2021-09-19 ENCOUNTER — Ambulatory Visit (INDEPENDENT_AMBULATORY_CARE_PROVIDER_SITE_OTHER): Payer: Medicare Other | Admitting: Nurse Practitioner

## 2021-09-19 ENCOUNTER — Other Ambulatory Visit: Payer: Self-pay

## 2021-09-19 VITALS — BP 122/72 | Ht 62.0 in | Wt 153.0 lb

## 2021-09-19 DIAGNOSIS — Z780 Asymptomatic menopausal state: Secondary | ICD-10-CM

## 2021-09-19 DIAGNOSIS — Z01419 Encounter for gynecological examination (general) (routine) without abnormal findings: Secondary | ICD-10-CM | POA: Diagnosis not present

## 2021-09-19 DIAGNOSIS — R300 Dysuria: Secondary | ICD-10-CM

## 2021-09-19 LAB — URINALYSIS, COMPLETE W/RFL CULTURE
Bacteria, UA: NONE SEEN /HPF
Bilirubin Urine: NEGATIVE
Glucose, UA: NEGATIVE
Hgb urine dipstick: NEGATIVE
Hyaline Cast: NONE SEEN /LPF
Ketones, ur: NEGATIVE
Leukocyte Esterase: NEGATIVE
Nitrites, Initial: NEGATIVE
Protein, ur: NEGATIVE
RBC / HPF: NONE SEEN /HPF (ref 0–2)
Specific Gravity, Urine: 1.002 (ref 1.001–1.035)
WBC, UA: NONE SEEN /HPF (ref 0–5)
pH: 5.5 (ref 5.0–8.0)

## 2021-09-19 LAB — NO CULTURE INDICATED

## 2021-09-19 NOTE — Progress Notes (Signed)
   Deborah Alvarez 09-14-1951 [id number]   History:  70 y.o. G3P3003 presents for breast and pelvic exam. Complains o mild discomfort with urination. She does have vaginal irritation s/t dryness and stopped using coconut oil. Postmenopausal - no HRT. 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 Hypothyroidism managed by endocrinology. Osteopenia managed by PCP. Mother being treated for breast cancer.   Gynecologic History Patient's last menstrual period was 12/24/1984.   Contraception: status post hysterectomy Sexually active: Yes  Health Maintenance Last Pap: 07/17/2016. Results were: Normal Last mammogram: 05/2021. Results were: Normal Last colonoscopy: 2015. Results were: Normal, 10-year recall Last Dexa: 05/2021. Results were: osteopenia  Past medical history, past surgical history, family history and social history were all reviewed and documented in the EPIC chart. Married. Retired.   ROS:  A ROS was performed and pertinent positives and negatives are included.  Exam:  Vitals:   09/19/21 1418  BP: 122/72  Weight: 153 lb (69.4 kg)  Height: 5\' 2"  (1.575 m)   Body mass index is 27.98 kg/m.  General appearance:  Normal Thyroid:  Symmetrical, normal in size, without palpable masses or nodularity. Respiratory  Auscultation:  Clear without wheezing or rhonchi Cardiovascular  Auscultation:  Regular rate, without rubs, murmurs or gallops  Edema/varicosities:  Not grossly evident Abdominal  Soft,nontender, without masses, guarding or rebound.  Liver/spleen:  No organomegaly noted  Hernia:  None appreciated  Skin  Inspection:  Grossly normal Breasts: Examined lying and sitting.   Right: Without masses, retractions, nipple discharge or axillary adenopathy.   Left: Without masses, retractions, nipple discharge or axillary adenopathy. Genitourinary   Inguinal/mons:  Normal without inguinal adenopathy  External genitalia:  Normal appearing vulva with no masses,  tenderness, or lesions  BUS/Urethra/Skene's glands:  Normal  Vagina:  Atrophic changes  Cervix:  Absent  Uterus:  Absent  Adnexa/parametria:     Rt: Normal in size, without masses or tenderness.   Lt: Normal in size, without masses or tenderness.  Anus and perineum: Normal  Digital rectal exam: Normal sphincter tone without palpated masses or tenderness  Patient informed chaperone available to be present for breast and pelvic exam. Patient has requested no chaperone to be present. Patient has been advised what will be completed during breast and pelvic exam.   Assessment/Plan:  70 y.o. G3P3003 for breast and pelvic exam.   Well female exam with routine gynecological exam - Education provided on SBEs, importance of preventative screenings, current guidelines, high calcium diet, regular exercise, and multivitamin daily. Labs with PCP.   Postmenopausal - no HRT.   Burning with urination - Plan: Urinalysis,Complete w/RFL Culture. UA unremarkable. Discomfort likely from atrophic vaginitis. Recommend restating coconut oil. This has always worked well for her.   Screening for cervical cancer - history of severe dysplasia prior to hysterectomy in 1986, subsequent paps normal. Discussed current guidelines and option to stop screening and she is agreeable.   Screening for breast cancer - Normal mammogram history.  Continue annual screenings.  Normal breast exam today.  Screening for colon cancer - 2015 colonoscopy. Will repeat at GI's recommended interval.   Screening for osteoporosis - osteopenia. Dexa managed by PCP. Continue Vitamin D + Calcium and regular exercise.   Return in 1 year for breast and pelvic exam.   2016 DNP, 2:45 PM 09/19/2021

## 2021-10-27 DIAGNOSIS — Z23 Encounter for immunization: Secondary | ICD-10-CM | POA: Diagnosis not present

## 2022-04-20 DIAGNOSIS — M48061 Spinal stenosis, lumbar region without neurogenic claudication: Secondary | ICD-10-CM | POA: Diagnosis not present

## 2022-04-20 DIAGNOSIS — R197 Diarrhea, unspecified: Secondary | ICD-10-CM | POA: Diagnosis not present

## 2022-04-20 DIAGNOSIS — M5430 Sciatica, unspecified side: Secondary | ICD-10-CM | POA: Diagnosis not present

## 2022-05-29 DIAGNOSIS — E039 Hypothyroidism, unspecified: Secondary | ICD-10-CM | POA: Diagnosis not present

## 2022-08-06 DIAGNOSIS — H25013 Cortical age-related cataract, bilateral: Secondary | ICD-10-CM | POA: Diagnosis not present

## 2022-08-06 DIAGNOSIS — H5203 Hypermetropia, bilateral: Secondary | ICD-10-CM | POA: Diagnosis not present

## 2022-08-06 DIAGNOSIS — H2513 Age-related nuclear cataract, bilateral: Secondary | ICD-10-CM | POA: Diagnosis not present

## 2022-08-09 DIAGNOSIS — E039 Hypothyroidism, unspecified: Secondary | ICD-10-CM | POA: Diagnosis not present

## 2022-08-29 DIAGNOSIS — Z0000 Encounter for general adult medical examination without abnormal findings: Secondary | ICD-10-CM | POA: Diagnosis not present

## 2022-08-29 DIAGNOSIS — Z13220 Encounter for screening for lipoid disorders: Secondary | ICD-10-CM | POA: Diagnosis not present

## 2022-08-29 DIAGNOSIS — E559 Vitamin D deficiency, unspecified: Secondary | ICD-10-CM | POA: Diagnosis not present

## 2022-09-03 DIAGNOSIS — M48061 Spinal stenosis, lumbar region without neurogenic claudication: Secondary | ICD-10-CM | POA: Diagnosis not present

## 2022-09-03 DIAGNOSIS — Z0000 Encounter for general adult medical examination without abnormal findings: Secondary | ICD-10-CM | POA: Diagnosis not present

## 2022-09-03 DIAGNOSIS — E781 Pure hyperglyceridemia: Secondary | ICD-10-CM | POA: Diagnosis not present

## 2022-09-03 DIAGNOSIS — E039 Hypothyroidism, unspecified: Secondary | ICD-10-CM | POA: Diagnosis not present

## 2022-09-03 DIAGNOSIS — R7401 Elevation of levels of liver transaminase levels: Secondary | ICD-10-CM | POA: Diagnosis not present

## 2022-09-03 DIAGNOSIS — M8588 Other specified disorders of bone density and structure, other site: Secondary | ICD-10-CM | POA: Diagnosis not present

## 2022-09-03 DIAGNOSIS — E559 Vitamin D deficiency, unspecified: Secondary | ICD-10-CM | POA: Diagnosis not present

## 2022-09-20 ENCOUNTER — Ambulatory Visit (INDEPENDENT_AMBULATORY_CARE_PROVIDER_SITE_OTHER): Payer: No Typology Code available for payment source | Admitting: Nurse Practitioner

## 2022-09-20 ENCOUNTER — Encounter: Payer: Self-pay | Admitting: Nurse Practitioner

## 2022-09-20 VITALS — BP 100/70 | HR 84 | Resp 14 | Ht 61.75 in | Wt 161.0 lb

## 2022-09-20 DIAGNOSIS — Z01419 Encounter for gynecological examination (general) (routine) without abnormal findings: Secondary | ICD-10-CM

## 2022-09-20 DIAGNOSIS — Z780 Asymptomatic menopausal state: Secondary | ICD-10-CM

## 2022-09-20 DIAGNOSIS — N898 Other specified noninflammatory disorders of vagina: Secondary | ICD-10-CM

## 2022-09-20 DIAGNOSIS — M8580 Other specified disorders of bone density and structure, unspecified site: Secondary | ICD-10-CM | POA: Diagnosis not present

## 2022-09-20 LAB — WET PREP FOR TRICH, YEAST, CLUE

## 2022-09-20 NOTE — Progress Notes (Signed)
   Deborah Alvarez [birth year] [id number]   History:  71 y.o. G3P3003 presents for breast and pelvic exam. Complains of dark yellow/brown vaginal discharge without itching or odor. Postmenopausal - no HRT. 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 Hypothyroidism managed by endocrinology. Osteopenia managed by PCP. Very active, has Physiological scientist.   Gynecologic History Patient's last menstrual period was 12/24/1984.   Contraception: status post hysterectomy Sexually active: No  Health Maintenance Last Pap: 07/17/2016. Results were: Normal Last mammogram: 04/2022. Results were: Normal Last colonoscopy: 2014. Results were: Normal, 10-year recall Last Dexa: 05/2021. Results were: osteopenia per patient  Past medical history, past surgical history, family history and social history were all reviewed and documented in the EPIC chart. 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 Son and daughter, 55 yo and 54 yo grandsons.   ROS:  A ROS was performed and pertinent positives and negatives are included.  Exam:  Vitals:   09/20/22 1530  BP: 100/70  Pulse: 84  Resp: 14  Weight: 161 lb (73 kg)  Height: 5' 1.75" (1.568 m)    Body mass index is 29.69 kg/m.  General appearance:  Normal Thyroid:  Symmetrical, normal in size, without palpable masses or nodularity. Respiratory  Auscultation:  Clear without wheezing or rhonchi Cardiovascular  Auscultation:  Regular rate, without rubs, murmurs or gallops  Edema/varicosities:  Not grossly evident Abdominal  Soft,nontender, without masses, guarding or rebound.  Liver/spleen:  No organomegaly noted  Hernia:  None appreciated  Skin  Inspection:  Grossly normal Breasts: Examined lying and sitting.   Right: Without masses, retractions, nipple discharge or axillary adenopathy.   Left: Without masses, retractions, nipple discharge or axillary adenopathy. Genitourinary   Inguinal/mons:  Normal without inguinal adenopathy  External  genitalia:  Normal appearing vulva with no masses, tenderness, or lesions  BUS/Urethra/Skene's glands:  Normal  Vagina:  Atrophic changes. Small mount of yellow discharge present  Cervix:  Absent  Uterus:  Absent  Adnexa/parametria:     Rt: Normal in size, without masses or tenderness.   Lt: Normal in size, without masses or tenderness.  Anus and perineum: Normal  Digital rectal exam: Normal sphincter tone without palpated masses or tenderness  Patient informed chaperone available to be present for breast and pelvic exam. Patient has requested no chaperone to be present. Patient has been advised what will be completed during breast and pelvic exam.   Wet prep negative for pathogens  Assessment/Plan:  71 y.o. G3P3003 for breast and pelvic exam.   Encounter for breast and pelvic examination - Education provided on SBEs, importance of preventative screenings, current guidelines, high calcium diet, regular exercise, and multivitamin daily. Labs with PCP.   Postmenopausal - no HRT   Osteopenia, unspecified location - managed by PCP. Continue Vitamin D + Calcium supplement and regular exercise.   Vaginal discharge - Plan: [place], CLUE. Negative for pathogens.   Screening for cervical cancer - History of severe dysplasia prior to hysterectomy in 1986, subsequent paps normal. No longer screening per guidelines.   Screening for breast cancer - Normal mammogram history.  Continue annual screenings.  Normal breast exam today.  Screening for colon cancer - 2015 colonoscopy. Will repeat at GI's recommended interval.   Return in 2 years for breast and pelvic exam.     Tamela Gammon DNP, 3:59 PM 09/20/2022

## 2022-11-07 DIAGNOSIS — M5412 Radiculopathy, cervical region: Secondary | ICD-10-CM | POA: Diagnosis not present

## 2022-11-19 DIAGNOSIS — M5416 Radiculopathy, lumbar region: Secondary | ICD-10-CM | POA: Diagnosis not present

## 2022-11-19 DIAGNOSIS — M5412 Radiculopathy, cervical region: Secondary | ICD-10-CM | POA: Diagnosis not present

## 2023-04-09 DIAGNOSIS — H9190 Unspecified hearing loss, unspecified ear: Secondary | ICD-10-CM | POA: Diagnosis not present

## 2023-04-09 DIAGNOSIS — I10 Essential (primary) hypertension: Secondary | ICD-10-CM | POA: Diagnosis not present

## 2023-06-03 DIAGNOSIS — Z1231 Encounter for screening mammogram for malignant neoplasm of breast: Secondary | ICD-10-CM | POA: Diagnosis not present

## 2023-06-03 DIAGNOSIS — M8588 Other specified disorders of bone density and structure, other site: Secondary | ICD-10-CM | POA: Diagnosis not present

## 2023-06-06 DIAGNOSIS — E039 Hypothyroidism, unspecified: Secondary | ICD-10-CM | POA: Diagnosis not present

## 2023-06-13 DIAGNOSIS — E039 Hypothyroidism, unspecified: Secondary | ICD-10-CM | POA: Diagnosis not present

## 2023-06-26 DIAGNOSIS — J029 Acute pharyngitis, unspecified: Secondary | ICD-10-CM | POA: Diagnosis not present

## 2023-08-13 DIAGNOSIS — E039 Hypothyroidism, unspecified: Secondary | ICD-10-CM | POA: Diagnosis not present

## 2023-08-14 DIAGNOSIS — H2513 Age-related nuclear cataract, bilateral: Secondary | ICD-10-CM | POA: Diagnosis not present

## 2023-08-14 DIAGNOSIS — H348312 Tributary (branch) retinal vein occlusion, right eye, stable: Secondary | ICD-10-CM | POA: Diagnosis not present

## 2023-08-14 DIAGNOSIS — H5203 Hypermetropia, bilateral: Secondary | ICD-10-CM | POA: Diagnosis not present

## 2023-08-14 DIAGNOSIS — H349 Unspecified retinal vascular occlusion: Secondary | ICD-10-CM | POA: Diagnosis not present

## 2023-09-04 DIAGNOSIS — E559 Vitamin D deficiency, unspecified: Secondary | ICD-10-CM | POA: Diagnosis not present

## 2023-09-04 DIAGNOSIS — E781 Pure hyperglyceridemia: Secondary | ICD-10-CM | POA: Diagnosis not present

## 2023-09-04 DIAGNOSIS — Z79899 Other long term (current) drug therapy: Secondary | ICD-10-CM | POA: Diagnosis not present

## 2023-09-04 DIAGNOSIS — E039 Hypothyroidism, unspecified: Secondary | ICD-10-CM | POA: Diagnosis not present

## 2023-09-06 DIAGNOSIS — M5430 Sciatica, unspecified side: Secondary | ICD-10-CM | POA: Diagnosis not present

## 2023-09-06 DIAGNOSIS — E559 Vitamin D deficiency, unspecified: Secondary | ICD-10-CM | POA: Diagnosis not present

## 2023-09-06 DIAGNOSIS — Z79899 Other long term (current) drug therapy: Secondary | ICD-10-CM | POA: Diagnosis not present

## 2023-09-06 DIAGNOSIS — E039 Hypothyroidism, unspecified: Secondary | ICD-10-CM | POA: Diagnosis not present

## 2023-09-06 DIAGNOSIS — N1831 Chronic kidney disease, stage 3a: Secondary | ICD-10-CM | POA: Diagnosis not present

## 2023-09-06 DIAGNOSIS — M48061 Spinal stenosis, lumbar region without neurogenic claudication: Secondary | ICD-10-CM | POA: Diagnosis not present

## 2023-09-06 DIAGNOSIS — Z0000 Encounter for general adult medical examination without abnormal findings: Secondary | ICD-10-CM | POA: Diagnosis not present

## 2023-09-06 DIAGNOSIS — M5412 Radiculopathy, cervical region: Secondary | ICD-10-CM | POA: Diagnosis not present

## 2023-10-16 DIAGNOSIS — M5416 Radiculopathy, lumbar region: Secondary | ICD-10-CM | POA: Diagnosis not present

## 2023-10-22 DIAGNOSIS — Z23 Encounter for immunization: Secondary | ICD-10-CM | POA: Diagnosis not present

## 2023-11-11 DIAGNOSIS — E039 Hypothyroidism, unspecified: Secondary | ICD-10-CM | POA: Diagnosis not present

## 2024-01-13 DIAGNOSIS — E039 Hypothyroidism, unspecified: Secondary | ICD-10-CM | POA: Diagnosis not present

## 2024-01-29 DIAGNOSIS — M7061 Trochanteric bursitis, right hip: Secondary | ICD-10-CM | POA: Diagnosis not present

## 2024-01-29 DIAGNOSIS — M545 Low back pain, unspecified: Secondary | ICD-10-CM | POA: Diagnosis not present

## 2024-03-03 DIAGNOSIS — K219 Gastro-esophageal reflux disease without esophagitis: Secondary | ICD-10-CM | POA: Diagnosis not present

## 2024-03-03 DIAGNOSIS — E039 Hypothyroidism, unspecified: Secondary | ICD-10-CM | POA: Diagnosis not present

## 2024-03-03 DIAGNOSIS — Z1211 Encounter for screening for malignant neoplasm of colon: Secondary | ICD-10-CM | POA: Diagnosis not present

## 2024-03-03 DIAGNOSIS — K625 Hemorrhage of anus and rectum: Secondary | ICD-10-CM | POA: Diagnosis not present

## 2024-03-09 DIAGNOSIS — H348312 Tributary (branch) retinal vein occlusion, right eye, stable: Secondary | ICD-10-CM | POA: Diagnosis not present

## 2024-03-11 DIAGNOSIS — M545 Low back pain, unspecified: Secondary | ICD-10-CM | POA: Diagnosis not present

## 2024-04-27 DIAGNOSIS — K625 Hemorrhage of anus and rectum: Secondary | ICD-10-CM | POA: Diagnosis not present

## 2024-04-27 DIAGNOSIS — Z1211 Encounter for screening for malignant neoplasm of colon: Secondary | ICD-10-CM | POA: Diagnosis not present

## 2024-04-27 DIAGNOSIS — Z860102 Personal history of hyperplastic colon polyps: Secondary | ICD-10-CM | POA: Diagnosis not present

## 2024-04-27 DIAGNOSIS — K648 Other hemorrhoids: Secondary | ICD-10-CM | POA: Diagnosis not present

## 2024-06-03 DIAGNOSIS — Z1231 Encounter for screening mammogram for malignant neoplasm of breast: Secondary | ICD-10-CM | POA: Diagnosis not present

## 2024-06-15 DIAGNOSIS — E039 Hypothyroidism, unspecified: Secondary | ICD-10-CM | POA: Diagnosis not present

## 2024-06-22 DIAGNOSIS — E039 Hypothyroidism, unspecified: Secondary | ICD-10-CM | POA: Diagnosis not present

## 2024-08-25 DIAGNOSIS — E039 Hypothyroidism, unspecified: Secondary | ICD-10-CM | POA: Diagnosis not present

## 2024-09-17 ENCOUNTER — Encounter: Payer: Self-pay | Admitting: Nurse Practitioner

## 2024-09-17 ENCOUNTER — Ambulatory Visit (INDEPENDENT_AMBULATORY_CARE_PROVIDER_SITE_OTHER): Admitting: Nurse Practitioner

## 2024-09-17 VITALS — BP 110/72 | HR 93 | Ht 60.75 in | Wt 163.0 lb

## 2024-09-17 DIAGNOSIS — N952 Postmenopausal atrophic vaginitis: Secondary | ICD-10-CM | POA: Diagnosis not present

## 2024-09-17 DIAGNOSIS — Z9189 Other specified personal risk factors, not elsewhere classified: Secondary | ICD-10-CM

## 2024-09-17 DIAGNOSIS — Z01419 Encounter for gynecological examination (general) (routine) without abnormal findings: Secondary | ICD-10-CM

## 2024-09-17 DIAGNOSIS — Z780 Asymptomatic menopausal state: Secondary | ICD-10-CM

## 2024-09-17 MED ORDER — ESTRADIOL 0.1 MG/GM VA CREA: 1.0000 g | TOPICAL_CREAM | VAGINAL | 2 refills | Status: AC

## 2024-09-17 NOTE — Progress Notes (Signed)
   Deborah Alvarez 13-Mar-1951 [id number]   History:  73 y.o. G3P3003 presents for breast and pelvic exam. Postmenopausal - no HRT. 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 Complains of vaginal burning and dryness. Hypothyroidism managed by endocrinology. Very active, has Systems analyst.   Gynecologic History Patient's last menstrual period was 12/24/1984.   Contraception: status post hysterectomy Sexually active: Rarely  Health Maintenance Last Pap: 07/17/2016. Results were: Normal Last mammogram: 04/2024. Results were: Normal Last colonoscopy: 02/2024. Results were: Normal, 10-year recall Last Dexa: 2024. Results were: Normal per patient  Past medical history, past surgical history, family history and social history were all reviewed and documented in the EPIC chart. Married. Retired. Son and daughter.   ROS:  A ROS was performed and pertinent positives and negatives are included.  Exam:  Vitals:   09/17/24 1335  BP: 110/72  Pulse: 93  SpO2: 98%  Weight: 163 lb (73.9 kg)  Height: 5' 0.75 (1.543 m)     Body mass index is 31.05 kg/m.  General appearance:  Normal Thyroid :  Symmetrical, normal in size, without palpable masses or nodularity. Respiratory  Auscultation:  Clear without wheezing or rhonchi Cardiovascular  Auscultation:  Regular rate, without rubs, murmurs or gallops  Edema/varicosities:  Not grossly evident Abdominal  Soft,nontender, without masses, guarding or rebound.  Liver/spleen:  No organomegaly noted  Hernia:  None appreciated  Skin  Inspection:  Grossly normal Breasts: Examined lying and sitting.   Right: Without masses, retractions, nipple discharge or axillary adenopathy.   Left: Without masses, retractions, nipple discharge or axillary adenopathy. Pelvic: External genitalia:  no lesions              Urethra:  normal appearing urethra with no masses, tenderness or lesions              Bartholins and Skenes: normal                  Vagina: normal appearing vagina with normal color and discharge, no lesions. Atrophic changes              Cervix: absent Bimanual Exam:  Uterus: absent              Adnexa: no mass, fullness, tenderness              Rectovaginal: Deferred              Anus:  normal, no lesions  Deborah Alvarez, CMA present as chaperone.   Assessment/Plan:  73 y.o. [id number] for breast and pelvic exam.   Encounter for breast and pelvic examination - Education provided on SBEs, importance of preventative screenings, current guidelines, high calcium diet, regular exercise, and multivitamin daily. Labs with PCP.   Postmenopausal - no HRT   Vaginal atrophy - Plan: estradiol  (ESTRACE  VAGINAL) 0.1 MG/GM vaginal cream twice weekly. Will use nightly first 2 weeks.   Screening for cervical cancer - History of severe dysplasia prior to hysterectomy in 1986, subsequent paps normal. No longer screening per guidelines.   Screening for breast cancer - Normal mammogram history.  Continue annual screenings.  Normal breast exam today.  Screening for colon cancer - 2025 colonoscopy. Will repeat at GI's recommended interval.   Screening for osteoporosis - DXA 2024. Reports normal bone density. Managed by PCP.   Return in about 1 year (around 09/17/2025) for B&P (high risk).     Deborah DELENA Shutter DNP, 2:08 PM 09/17/2024

## 2024-10-06 ENCOUNTER — Other Ambulatory Visit (HOSPITAL_BASED_OUTPATIENT_CLINIC_OR_DEPARTMENT_OTHER): Payer: Self-pay | Admitting: Family Medicine

## 2024-10-06 DIAGNOSIS — E7800 Pure hypercholesterolemia, unspecified: Secondary | ICD-10-CM

## 2024-10-13 ENCOUNTER — Ambulatory Visit (HOSPITAL_BASED_OUTPATIENT_CLINIC_OR_DEPARTMENT_OTHER)
Admission: RE | Admit: 2024-10-13 | Discharge: 2024-10-13 | Disposition: A | Discharge status: Home / Self Care | Payer: Self-pay | Source: Ambulatory Visit | Attending: Family Medicine | Admitting: Family Medicine

## 2024-10-13 DIAGNOSIS — E7800 Pure hypercholesterolemia, unspecified: Secondary | ICD-10-CM | POA: Insufficient documentation
